# Patient Record
Sex: Female | Born: 1990 | Race: Black or African American | Hispanic: No | Smoking: Former smoker
Health system: Southern US, Community
[De-identification: ages and names within clinical notes are randomized; demographics above are authoritative.]

## PROBLEM LIST (undated history)

## (undated) HISTORY — PX: TUBAL LIGATION: SHX77

---
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
## 2009-07-20 DIAGNOSIS — A6000 Herpesviral infection of urogenital system, unspecified: Secondary | ICD-10-CM | POA: Insufficient documentation

## 2010-01-09 ENCOUNTER — Emergency Department: Payer: Self-pay | Admitting: Emergency Medicine

## 2010-05-18 ENCOUNTER — Observation Stay: Payer: Self-pay | Admitting: Obstetrics and Gynecology

## 2010-05-25 ENCOUNTER — Ambulatory Visit: Payer: Self-pay | Admitting: Family Medicine

## 2010-08-21 ENCOUNTER — Observation Stay: Payer: Self-pay | Admitting: Obstetrics and Gynecology

## 2010-08-24 ENCOUNTER — Ambulatory Visit: Payer: Self-pay | Admitting: Obstetrics and Gynecology

## 2010-08-25 ENCOUNTER — Inpatient Hospital Stay: Payer: Self-pay | Admitting: Obstetrics and Gynecology

## 2011-02-13 LAB — HM PAP SMEAR: HM Pap smear: NEGATIVE

## 2011-11-01 ENCOUNTER — Emergency Department: Payer: Self-pay | Admitting: Emergency Medicine

## 2011-11-01 LAB — URINALYSIS, COMPLETE
Bilirubin,UR: NEGATIVE
Glucose,UR: NEGATIVE mg/dL (ref 0–75)
Ketone: NEGATIVE
RBC,UR: 39 /HPF (ref 0–5)
Specific Gravity: 1.025 (ref 1.003–1.030)
Squamous Epithelial: 48
Transitional Epi: 3
WBC UR: 145 /HPF (ref 0–5)

## 2011-11-01 LAB — CBC
HCT: 37.3 % (ref 35.0–47.0)
HGB: 12.5 g/dL (ref 12.0–16.0)
Platelet: 186 10*3/uL (ref 150–440)
RBC: 4.33 10*6/uL (ref 3.80–5.20)
RDW: 15.8 % — ABNORMAL HIGH (ref 11.5–14.5)
WBC: 14.1 10*3/uL — ABNORMAL HIGH (ref 3.6–11.0)

## 2011-11-01 LAB — COMPREHENSIVE METABOLIC PANEL
Co2: 22 mmol/L (ref 21–32)
EGFR (African American): >60
EGFR (Non-African Amer.): >60
Glucose: 106 mg/dL — ABNORMAL HIGH (ref 65–99)
SGOT(AST): 9 U/L — ABNORMAL LOW (ref 15–37)
SGPT (ALT): 11 U/L — ABNORMAL LOW (ref 12–78)

## 2011-11-01 LAB — HCG, QUANTITATIVE, PREGNANCY: Beta Hcg, Quant.: 53803 m[IU]/mL — ABNORMAL HIGH

## 2011-11-01 LAB — WET PREP, GENITAL

## 2011-11-30 ENCOUNTER — Ambulatory Visit: Payer: Self-pay | Admitting: Advanced Practice Midwife

## 2012-01-21 ENCOUNTER — Emergency Department: Payer: Self-pay | Admitting: Emergency Medicine

## 2012-01-21 LAB — URINALYSIS, COMPLETE
Bilirubin,UR: NEGATIVE
Glucose,UR: NEGATIVE mg/dL (ref 0–75)
Nitrite: NEGATIVE
Ph: 5 (ref 4.5–8.0)
Specific Gravity: 1.026 (ref 1.003–1.030)
Squamous Epithelial: 20
WBC UR: 34 /HPF (ref 0–5)

## 2012-01-21 LAB — COMPREHENSIVE METABOLIC PANEL
Anion Gap: 10 (ref 7–16)
BUN: 8 mg/dL (ref 7–18)
Bilirubin,Total: 0.2 mg/dL (ref 0.2–1.0)
Chloride: 106 mmol/L (ref 98–107)
Creatinine: 0.57 mg/dL — ABNORMAL LOW (ref 0.60–1.30)
EGFR (Non-African Amer.): >60
Osmolality: 275 (ref 275–301)
Potassium: 3.1 mmol/L — ABNORMAL LOW (ref 3.5–5.1)
Sodium: 138 mmol/L (ref 136–145)
Total Protein: 7.3 g/dL (ref 6.4–8.2)

## 2012-01-21 LAB — CBC
MCH: 29.2 pg (ref 26.0–34.0)
MCHC: 33.3 g/dL (ref 32.0–36.0)
Platelet: 162 10*3/uL (ref 150–440)
RDW: 13.8 % (ref 11.5–14.5)
WBC: 10.7 10*3/uL (ref 3.6–11.0)

## 2012-01-23 LAB — URINE CULTURE

## 2012-03-04 ENCOUNTER — Observation Stay: Payer: Self-pay | Admitting: Obstetrics and Gynecology

## 2012-03-04 LAB — URINALYSIS, COMPLETE
Bilirubin,UR: NEGATIVE
Blood: NEGATIVE
Glucose,UR: NEGATIVE mg/dL (ref 0–75)
Nitrite: NEGATIVE
Protein: 30
RBC,UR: 18 /HPF (ref 0–5)
Specific Gravity: 1.026 (ref 1.003–1.030)

## 2012-04-16 ENCOUNTER — Observation Stay: Payer: Self-pay | Admitting: Obstetrics and Gynecology

## 2012-04-19 ENCOUNTER — Observation Stay: Payer: Self-pay

## 2012-04-20 LAB — URINALYSIS, COMPLETE
Bilirubin,UR: NEGATIVE
Blood: NEGATIVE
Glucose,UR: NEGATIVE mg/dL (ref 0–75)
Ph: 6 (ref 4.5–8.0)
RBC,UR: 4 /HPF (ref 0–5)
Squamous Epithelial: 8
Transitional Epi: 1
WBC UR: 13 /HPF (ref 0–5)

## 2012-04-20 LAB — PROTEIN / CREATININE RATIO, URINE: Protein, Random Urine: 60 mg/dL — ABNORMAL HIGH (ref 0–12)

## 2012-04-27 ENCOUNTER — Inpatient Hospital Stay: Payer: Self-pay | Admitting: Obstetrics and Gynecology

## 2012-04-27 LAB — BASIC METABOLIC PANEL
BUN: 4 mg/dL — ABNORMAL LOW (ref 7–18)
Calcium, Total: 8.4 mg/dL — ABNORMAL LOW (ref 8.5–10.1)
Chloride: 106 mmol/L (ref 98–107)
Co2: 26 mmol/L (ref 21–32)
Creatinine: 0.53 mg/dL — ABNORMAL LOW (ref 0.60–1.30)
EGFR (African American): >60
Osmolality: 275 (ref 275–301)
Potassium: 2.8 mmol/L — ABNORMAL LOW (ref 3.5–5.1)

## 2012-04-27 LAB — PIH PROFILE
Anion Gap: 7 (ref 7–16)
Co2: 26 mmol/L (ref 21–32)
Creatinine: 0.41 mg/dL — ABNORMAL LOW (ref 0.60–1.30)
HCT: 30.1 % — ABNORMAL LOW (ref 35.0–47.0)
HGB: 9.9 g/dL — ABNORMAL LOW (ref 12.0–16.0)
MCHC: 32.9 g/dL (ref 32.0–36.0)
Osmolality: 273 (ref 275–301)
Platelet: 162 10*3/uL (ref 150–440)
Potassium: 2.8 mmol/L — ABNORMAL LOW (ref 3.5–5.1)
RDW: 15.4 % — ABNORMAL HIGH (ref 11.5–14.5)
Uric Acid: 4.7 mg/dL (ref 2.6–6.0)
WBC: 8.9 10*3/uL (ref 3.6–11.0)

## 2012-04-27 LAB — PROTEIN / CREATININE RATIO, URINE
Creatinine, Urine: 99.1 mg/dL (ref 30.0–125.0)
Protein, Random Urine: 14 mg/dL — ABNORMAL HIGH (ref 0–12)

## 2012-04-28 LAB — BASIC METABOLIC PANEL
Calcium, Total: 8.1 mg/dL — ABNORMAL LOW (ref 8.5–10.1)
Co2: 26 mmol/L (ref 21–32)
Glucose: 76 mg/dL (ref 65–99)
Osmolality: 272 (ref 275–301)
Sodium: 138 mmol/L (ref 136–145)

## 2012-04-29 LAB — BASIC METABOLIC PANEL
Anion Gap: 6 — ABNORMAL LOW (ref 7–16)
BUN: 5 mg/dL — ABNORMAL LOW (ref 7–18)
Calcium, Total: 8.6 mg/dL (ref 8.5–10.1)
Chloride: 108 mmol/L — ABNORMAL HIGH (ref 98–107)
EGFR (African American): >60
EGFR (Non-African Amer.): >60
Glucose: 81 mg/dL (ref 65–99)
Osmolality: 274 (ref 275–301)
Potassium: 3.8 mmol/L (ref 3.5–5.1)
Sodium: 139 mmol/L (ref 136–145)

## 2012-05-01 ENCOUNTER — Emergency Department: Payer: Self-pay | Admitting: Emergency Medicine

## 2012-05-01 LAB — COMPREHENSIVE METABOLIC PANEL
Albumin: 2.4 g/dL — ABNORMAL LOW (ref 3.4–5.0)
Alkaline Phosphatase: 121 U/L (ref 50–136)
Anion Gap: 8 (ref 7–16)
BUN: 6 mg/dL — ABNORMAL LOW (ref 7–18)
Bilirubin,Total: 0.5 mg/dL (ref 0.2–1.0)
Calcium, Total: 8.6 mg/dL (ref 8.5–10.1)
Chloride: 106 mmol/L (ref 98–107)
Creatinine: 0.67 mg/dL (ref 0.60–1.30)
Osmolality: 270 (ref 275–301)
Potassium: 3.6 mmol/L (ref 3.5–5.1)
SGOT(AST): 29 U/L (ref 15–37)
Sodium: 137 mmol/L (ref 136–145)
Total Protein: 6.6 g/dL (ref 6.4–8.2)

## 2012-05-01 LAB — URINALYSIS, COMPLETE
Bilirubin,UR: NEGATIVE
Glucose,UR: NEGATIVE mg/dL (ref 0–75)
Nitrite: NEGATIVE
Ph: 6 (ref 4.5–8.0)
Protein: 30
Specific Gravity: 1.023 (ref 1.003–1.030)
Squamous Epithelial: <1
WBC UR: 5 /HPF (ref 0–5)

## 2012-05-01 LAB — CBC WITH DIFFERENTIAL/PLATELET
Basophil #: 0 10*3/uL (ref 0.0–0.1)
Basophil %: 0.4 %
Eosinophil #: 0.2 10*3/uL (ref 0.0–0.7)
HGB: 9.1 g/dL — ABNORMAL LOW (ref 12.0–16.0)
Neutrophil #: 10.1 10*3/uL — ABNORMAL HIGH (ref 1.4–6.5)
Neutrophil %: 86.1 %
Platelet: 183 10*3/uL (ref 150–440)
WBC: 11.7 10*3/uL — ABNORMAL HIGH (ref 3.6–11.0)

## 2012-05-01 LAB — LIPASE, BLOOD: Lipase: 58 U/L — ABNORMAL LOW (ref 73–393)

## 2012-05-23 ENCOUNTER — Emergency Department: Payer: Self-pay | Admitting: Emergency Medicine

## 2012-05-23 LAB — URINALYSIS, COMPLETE
Glucose,UR: NEGATIVE mg/dL (ref 0–75)
Nitrite: NEGATIVE
Protein: 100
Specific Gravity: 1.006 (ref 1.003–1.030)
Squamous Epithelial: 2
WBC UR: 466 /HPF (ref 0–5)

## 2012-05-23 LAB — COMPREHENSIVE METABOLIC PANEL
Albumin: 3.5 g/dL (ref 3.4–5.0)
Alkaline Phosphatase: 93 U/L (ref 50–136)
Anion Gap: 6 — ABNORMAL LOW (ref 7–16)
BUN: 7 mg/dL (ref 7–18)
Calcium, Total: 8.9 mg/dL (ref 8.5–10.1)
Creatinine: 0.88 mg/dL (ref 0.60–1.30)
EGFR (African American): >60
EGFR (Non-African Amer.): >60
Glucose: 110 mg/dL — ABNORMAL HIGH (ref 65–99)
Osmolality: 276 (ref 275–301)
SGOT(AST): 14 U/L — ABNORMAL LOW (ref 15–37)
SGPT (ALT): 14 U/L (ref 12–78)
Total Protein: 8.6 g/dL — ABNORMAL HIGH (ref 6.4–8.2)

## 2012-05-23 LAB — CBC
HCT: 37.4 % (ref 35.0–47.0)
HGB: 11.8 g/dL — ABNORMAL LOW (ref 12.0–16.0)
MCV: 80 fL (ref 80–100)
Platelet: 286 10*3/uL (ref 150–440)
RBC: 4.67 10*6/uL (ref 3.80–5.20)
RDW: 16.2 % — ABNORMAL HIGH (ref 11.5–14.5)
WBC: 13.3 10*3/uL — ABNORMAL HIGH (ref 3.6–11.0)

## 2012-05-25 LAB — URINE CULTURE

## 2013-03-26 ENCOUNTER — Emergency Department: Payer: Self-pay | Admitting: Internal Medicine

## 2013-06-15 ENCOUNTER — Emergency Department: Payer: Self-pay | Admitting: Emergency Medicine

## 2013-08-10 ENCOUNTER — Emergency Department: Payer: Self-pay | Admitting: Emergency Medicine

## 2013-08-10 LAB — CBC
HCT: 31 % — ABNORMAL LOW (ref 35.0–47.0)
HGB: 9.8 g/dL — ABNORMAL LOW (ref 12.0–16.0)
MCH: 23 pg — AB (ref 26.0–34.0)
MCHC: 31.6 g/dL — ABNORMAL LOW (ref 32.0–36.0)
MCV: 73 fL — ABNORMAL LOW (ref 80–100)
PLATELETS: 197 10*3/uL (ref 150–440)
RBC: 4.26 10*6/uL (ref 3.80–5.20)
RDW: 17.6 % — ABNORMAL HIGH (ref 11.5–14.5)
WBC: 12.4 10*3/uL — ABNORMAL HIGH (ref 3.6–11.0)

## 2013-08-10 LAB — URINALYSIS, COMPLETE
Bacteria: NONE SEEN
RBC,UR: 6300 /HPF (ref 0–5)
SPECIFIC GRAVITY: 1.034 (ref 1.003–1.030)
Squamous Epithelial: NONE SEEN
WBC UR: 82 /HPF (ref 0–5)

## 2013-08-10 LAB — BASIC METABOLIC PANEL
ANION GAP: 6 — AB (ref 7–16)
BUN: 17 mg/dL (ref 7–18)
CALCIUM: 8.4 mg/dL — AB (ref 8.5–10.1)
CHLORIDE: 107 mmol/L (ref 98–107)
CO2: 27 mmol/L (ref 21–32)
Creatinine: 1.17 mg/dL (ref 0.60–1.30)
EGFR (African American): >60
EGFR (Non-African Amer.): >60
GLUCOSE: 87 mg/dL (ref 65–99)
Osmolality: 280 (ref 275–301)
Potassium: 3.7 mmol/L (ref 3.5–5.1)
SODIUM: 140 mmol/L (ref 136–145)

## 2013-11-15 ENCOUNTER — Emergency Department: Payer: Self-pay | Admitting: Emergency Medicine

## 2014-04-27 ENCOUNTER — Emergency Department: Payer: Self-pay | Admitting: Internal Medicine

## 2014-05-28 NOTE — Op Note (Signed)
PATIENT NAME:  Sara GasmanWALKER, Sara S MR#:  [id number] DATE OF BIRTH:  26-Dec-1990  DATE OF PROCEDURE:  04/27/2012  DATE OF DELIVERY: 04/27/2012 at 1403.   PREOPERATIVE DIAGNOSES:  1.  Preeclampsia. 2.  Incisional pain.  3.  Hypokalemia.   POSTOPERATIVE DIAGNOSES: 1.  Preeclampsia. 2.  Incisional pain.  3.  Hypokalemia.   PROCEDURE:  1.  Elective repeat low transverse cesarean section.  2.  [place] bilateral tubal ligation.   ANESTHESIA: Spinal.   SURGEON: Suzy Bouchardhomas J Schermerhorn, M.D.   FIRST ASSISTANT: Gordy, scrub tech.   INDICATIONS: A 24 year old, gravida 3, para 2 patient at 2937 + 5 weeks estimated gestational age came in complaining of contractions and incisional pain. The patient was noted to have elevated blood pressures with blood pressures ranging systolically between 140 and 150 and diastolic between 80 and 100. The patient did complain of a headache the day of the procedure. The patient's electrolytes showed significant hypokalemia at a level of  2.8. The patient has elected for permanent sterilization.   PROCEDURE: After adequate spinal anesthesia, the patient was placed in the dorsal supine position. The patient's abdomen was prepped and draped in normal sterile fashion. A Pfannenstiel incision was made 2 fingerbreadths above the symphysis pubis. Sharp dissection was used to identify the fascia. The fascia was opened in the midline and opened in a transverse fashion. The superior aspect of the fascia was grasped with Kocher clamps and the rectus muscles were dissected free. The inferior aspect of the fascia was grasped with Kocher clamps and the pyramidalis muscle was dissected free. Entry into the peritoneal cavity was accomplished sharply. The vesicouterine peritoneal fold was opened and the bladder was reflected inferiorly. A low transverse uterine incision was made. Upon entry into the endometrial cavity, clear fluid resulted. The fetal head was brought to the incision. A vacuum  was applied to the occiput of the infant and one gentle pull allowed for delivery of the head. The vacuum was removed. The shoulders and body delivered without difficulty. A vigorous female was passed to Dr. Beckie Saltsasnadi who assigned Apgar scores of 9 and 9. The placenta was manually delivered and the uterus was exteriorized. The endometrial cavity was wiped clean with laparotomy tape and the cervix was opened with ring forceps. The ring forceps was passed off the operative field. The uterine incision was closed with a running 1 chromic suture. Good hemostasis was noted. The fallopian tubes and ovaries appeared normal. The posterior cul-de-sac was irrigated and suctioned.   Attention was directed to the patient's right fallopian tube, which was grasped at the midportion and two separate 0 plain gut sutures were applied and a 1.5 cm portion of the fallopian tube removed. Good hemostasis was noted. A similar procedure was repeated on the patient's left fallopian tube and again, 2 separate 0 plain gut sutures were placed on the fallopian tube and a 1.5 cm portion of the fallopian tube removed. Good hemostasis was noted. The uterus was placed back into the abdominal cavity without difficulty. The paracolic gutters were then wiped clean with laparotomy tape. Tubal ligation sites appeared hemostatic. The On-Q pump was then brought up to the operative field and the catheters were fed infraumbilically into a subfascial area. The fascia was then closed above this with 0 Vicryl suture in a running nonlocking fashion. Good approximation of edges. Subcutaneous tissues were irrigated and a few relaxation incisions were made to free up the subcutaneous tissues that had resulted from prior scarring. The skin was  reapproximated with the Insorb absorbable sutures with good cosmetic effect. The On-Q pump catheters were secured at the skin level with Dermabond and secured with Steri-Strips and each catheter was loaded with 5 mL of 0.5%  Marcaine. The patient did receive 2 grams IV Ancef prior to commencement of the case. There were no complications. Estimated blood loss 500 mL. Intraoperative fluids 2100 mL. The patient tolerated the procedure well, was taken to the recovery room in good condition.     ____________________________ Suzy Bouchard, MD tjs:aw D: 04/27/2012 14:55:35 ET T: 04/27/2012 15:08:47 ET JOB#: [id number]  cc: Suzy Bouchard, MD, <Dictator> Suzy Bouchard MD ELECTRONICALLY SIGNED 04/28/2012 8:40

## 2014-06-15 NOTE — H&P (Signed)
L&D Evaluation:  History:  HPI 24 yo at 37+5 edc 05/14/12 c/o ctx early this am. + incisional pain. On L+D bp elevated 140-150/ 80-100. + h/a today . Denies scotomata. Labs show Hypokalemia K+ 2.8 confirmed on repeat. 2 prior c/s . Pt desires BTL   Patient's Medical History h/o HSV,  ? PIH with first preg., anemia, obesity   Patient's Surgical History c/s x 2   Medications Pre Natal Vitamins   Allergies NKDA   Social History none   Family History Non-Contributory   ROS:  ROS All systems were reviewed.  HEENT, CNS, GI, GU, Respiratory, CV, Renal and Musculoskeletal systems were found to be normal.   Exam:  Vital Signs BP >140/90   Urine Protein c/p ratio141   General no apparent distress   Mental Status clear   Chest clear   Heart normal sinus rhythm, RRR   Abdomen gravid, non-tender   Estimated Fetal Weight Average for gestational age   Reflexes 1+   Pelvic TFT ext os/ long/ blt   Mebranes Intact   FHT normal rate with no decels, infrequent CTX   Fetal Heart Rate 140   Ucx irregular   Skin dry   Impression:  Impression Elevated BP+ H/A  c/w PIH, hypokalemia etiology uncertain, Ctx with incisional pain   Plan:  Plan IVF add KCL, given summation of the above issues will proceed to repeat c/s and BTL.   Electronic Signatures: Helen Cuff, Ihor Austinhomas J (MD)  (Signed 23-Mar-14 12:32)  Authored: L&D Evaluation   Last Updated: 23-Mar-14 12:32 by Suzy BouchardSchermerhorn, Danyetta Gillham J (MD)

## 2014-07-26 ENCOUNTER — Encounter: Payer: Self-pay | Admitting: Emergency Medicine

## 2014-07-26 ENCOUNTER — Emergency Department
Admission: EM | Admit: 2014-07-26 | Discharge: 2014-07-26 | Disposition: A | Discharge status: Home / Self Care | Payer: Managed Care, Other (non HMO) | Attending: Emergency Medicine | Admitting: Emergency Medicine

## 2014-07-26 DIAGNOSIS — Z720 Tobacco use: Secondary | ICD-10-CM | POA: Insufficient documentation

## 2014-07-26 DIAGNOSIS — R21 Rash and other nonspecific skin eruption: Secondary | ICD-10-CM | POA: Diagnosis present

## 2014-07-26 DIAGNOSIS — K047 Periapical abscess without sinus: Secondary | ICD-10-CM

## 2014-07-26 MED ORDER — AMOXICILLIN 500 MG PO TABS: 500.0000 mg | ORAL_TABLET | Freq: Two times a day (BID) | ORAL | Status: DC

## 2014-07-26 MED ORDER — KETOROLAC TROMETHAMINE 10 MG PO TABS
10.0000 mg | ORAL_TABLET | Freq: Once | ORAL | Status: AC
  Administered 2014-07-26: 10 mg via ORAL

## 2014-07-26 MED ORDER — TRAMADOL HCL 50 MG PO TABS
50.0000 mg | ORAL_TABLET | Freq: Once | ORAL | Status: AC
  Administered 2014-07-26: 50 mg via ORAL

## 2014-07-26 MED ORDER — TRAMADOL HCL 50 MG PO TABS: 50.0000 mg | ORAL_TABLET | Freq: Four times a day (QID) | ORAL | Status: DC | PRN

## 2014-07-26 MED ORDER — KETOROLAC TROMETHAMINE 10 MG PO TABS: 10.0000 mg | ORAL_TABLET | Freq: Four times a day (QID) | ORAL | Status: DC | PRN

## 2014-07-26 MED ORDER — TRAMADOL HCL 50 MG PO TABS
ORAL_TABLET | ORAL | Status: AC
  Administered 2014-07-26: 50 mg via ORAL
  Filled 2014-07-26: qty 1

## 2014-07-26 MED ORDER — AMOXICILLIN 500 MG PO CAPS
500.0000 mg | ORAL_CAPSULE | Freq: Once | ORAL | Status: AC
  Administered 2014-07-26: 500 mg via ORAL

## 2014-07-26 MED ORDER — AMOXICILLIN 500 MG PO CAPS
ORAL_CAPSULE | ORAL | Status: AC
  Administered 2014-07-26: 500 mg via ORAL
  Filled 2014-07-26: qty 1

## 2014-07-26 MED ORDER — KETOROLAC TROMETHAMINE 10 MG PO TABS
ORAL_TABLET | ORAL | Status: AC
  Administered 2014-07-26: 10 mg via ORAL
  Filled 2014-07-26: qty 1

## 2014-07-26 NOTE — ED Provider Notes (Signed)
[facility] Emergency Department Provider Note  ____________________________________________  Time seen: 2039   I have reviewed the triage vital signs and the nursing notes.   HISTORY  Chief Complaint Facial Pain    HPI Sara Morrison is a 24 y.o. female arrives here today with left sided jawline pain says it swells off-and-on particularly worse after eating says it is gone down with Motrin but causes bad headaches and continues to stay inflamed and painful she is unsure the cause of it currently rates it as about a 4 out of 10 sometimes up to an 8 out of 10 nothing making it particularly better or worse denies any other complaints at this time thinks it may be dental pain   History reviewed. No pertinent past medical history.  There are no active problems to display for this patient.   Past Surgical History  Procedure Laterality Date  . Tubal ligation      Current Outpatient Rx  Name  Route  Sig  Dispense  Refill  . amoxicillin (AMOXIL) 500 MG tablet   Oral   Take 1 tablet (500 mg total) by mouth 2 (two) times daily.   30 tablet   0   . ketorolac (TORADOL) 10 MG tablet   Oral   Take 1 tablet (10 mg total) by mouth every 6 (six) hours as needed.   20 tablet   0   . traMADol (ULTRAM) 50 MG tablet   Oral   Take 1 tablet (50 mg total) by mouth every 6 (six) hours as needed.   12 tablet   0     Allergies Review of patient's allergies indicates no known allergies.  No family history on file.  Social History History  Substance Use Topics  . Smoking status: Current Some Day Smoker  . Smokeless tobacco: Not on file  . Alcohol Use: No    Review of Systems Constitutional: No fever/chills Eyes: No visual changes. ENT: No sore throat. Cardiovascular: Denies chest pain. Respiratory: Denies shortness of breath. Gastrointestinal: No abdominal pain.  No nausea, no vomiting.  No diarrhea.  No constipation. Genitourinary: Negative for  dysuria. Musculoskeletal: Negative for back pain. Skin: Negative for rash. Neurological: Negative for headaches, focal weakness or numbness.  10-point ROS otherwise negative.  ____________________________________________   PHYSICAL EXAM:  VITAL SIGNS: ED Triage Vitals  Enc Vitals Group     BP 07/26/14 1917 158/100 mmHg     Pulse Rate 07/26/14 1917 94     Resp 07/26/14 1917 20     Temp 07/26/14 1917 98.2 F (36.8 C)     Temp Source 07/26/14 1917 Oral     SpO2 07/26/14 1917 100 %     Weight 07/26/14 1917 230 lb (104.327 kg)     Height 07/26/14 1917  (1.6 m)     Head Cir --      Peak Flow --      Pain Score 07/26/14 1918 4     Pain Loc --      Pain Edu? --      Excl. in GC? --     Constitutional: Alert and oriented. Well appearing and in no acute distress. Eyes: Conjunctivae are normal. PERRL. EOMI. Head: Atraumatic. Nose: No congestion/rhinnorhea. Mouth/Throat: Mucous membranes are moist. Mild gum irritation along the left mandible no focal abscess visualized she has a filling in one of her last molars wisdom teeth are not in the irritation to the gum above where the wisdom teeth  would evolve Neck: No stridor.    Cardiovascular: Normal rate, regular rhythm. Grossly normal heart sounds.  Good peripheral circulation. Respiratory: Normal respiratory effort.  No retractions. Lungs CTAB.  Musculoskeletal: No lower extremity tenderness nor edema.  No joint effusions. Neurologic:  Normal speech and language. No gross focal neurologic deficits are appreciated. Speech is normal. No gait instability. Skin:  Skin is warm, dry and intact. No rash noted. Psychiatric: Mood and affect are normal. Speech and behavior are normal.  ____________________________________________      PROCEDURES  Procedure(s) performed: None  Critical Care performed: No  ____________________________________________   INITIAL IMPRESSION / ASSESSMENT AND PLAN / ED COURSE  Pertinent labs &  imaging results that were available during my care of the patient were reviewed by me and considered in my medical decision making (see chart for details).  Initial impression on this patient is dental infection she has a tooth that is carried with a filling and it irritation around the gumline there that she points to for pain that continues to swell is along her gum started on amoxicillin and Motrin have follow-up with her dentist tomorrow return if any acute concerns or worsening symptoms ____________________________________________   FINAL CLINICAL IMPRESSION(S) / ED DIAGNOSES  Final diagnoses:  Dental infection      [name], PA-C 07/26/14 2108  Emily Filbert, MD 07/26/14 2142

## 2014-07-26 NOTE — ED Notes (Addendum)
Patient ambulatory to triage with steady gait, without difficulty or distress noted; pt reports left sided facial swelling for last few days causing head to hurt; st pain increases with attempts to eat and unsure if it is dental pain

## 2014-11-30 ENCOUNTER — Emergency Department
Admission: EM | Admit: 2014-11-30 | Discharge: 2014-11-30 | Disposition: A | Discharge status: Home / Self Care | Payer: Managed Care, Other (non HMO) | Attending: Emergency Medicine | Admitting: Emergency Medicine

## 2014-11-30 ENCOUNTER — Encounter: Payer: Self-pay | Admitting: *Deleted

## 2014-11-30 DIAGNOSIS — Z792 Long term (current) use of antibiotics: Secondary | ICD-10-CM | POA: Insufficient documentation

## 2014-11-30 DIAGNOSIS — J029 Acute pharyngitis, unspecified: Secondary | ICD-10-CM | POA: Diagnosis present

## 2014-11-30 DIAGNOSIS — J069 Acute upper respiratory infection, unspecified: Secondary | ICD-10-CM | POA: Diagnosis not present

## 2014-11-30 DIAGNOSIS — Z720 Tobacco use: Secondary | ICD-10-CM | POA: Insufficient documentation

## 2014-11-30 LAB — POCT RAPID STREP A: STREPTOCOCCUS, GROUP A SCREEN (DIRECT): NEGATIVE

## 2014-11-30 MED ORDER — FLUTICASONE PROPIONATE 50 MCG/ACT NA SUSP: 1.0000 | Freq: Every day | NASAL | Status: DC

## 2014-11-30 MED ORDER — BENZONATATE 100 MG PO CAPS: 100.0000 mg | ORAL_CAPSULE | Freq: Three times a day (TID) | ORAL | Status: DC | PRN

## 2014-11-30 NOTE — ED Notes (Addendum)
Pt has a sore throat and sinus congestion.    Sx began today.  Pt spitting up clear phlegm in triage.

## 2014-11-30 NOTE — ED Provider Notes (Signed)
[facility] Emergency Department Provider Note ____________________________________________  Time seen: 2010  I have reviewed the triage vital signs and the nursing notes.  HISTORY  Chief Complaint  Sore Throat  HPI Sara Morrison is a 24 y.o. female reports to the ED for evaluation of sore throat, cough, congestion that started yesterday. She has dosed 1 dose of Tylenol Cold and flu earlier today. She denies any interim fever, chills, or sweats. She also denies any sick contacts.She rates the pain to her throat at about a 5/10 in triage.  No past medical history on file.  There are no active problems to display for this patient.   Past Surgical History  Procedure Laterality Date  . Tubal ligation      Current Outpatient Rx  Name  Route  Sig  Dispense  Refill  . amoxicillin (AMOXIL) 500 MG tablet   Oral   Take 1 tablet (500 mg total) by mouth 2 (two) times daily.   30 tablet   0   . benzonatate (TESSALON PERLES) 100 MG capsule   Oral   Take 1 capsule (100 mg total) by mouth 3 (three) times daily as needed for cough (Take 1-2 per dose).   30 capsule   0   . fluticasone (FLONASE) 50 MCG/ACT nasal spray   Each Nare   Place 1 spray into both nostrils daily.   16 g   0   . ketorolac (TORADOL) 10 MG tablet   Oral   Take 1 tablet (10 mg total) by mouth every 6 (six) hours as needed.   20 tablet   0   . traMADol (ULTRAM) 50 MG tablet   Oral   Take 1 tablet (50 mg total) by mouth every 6 (six) hours as needed.   12 tablet   0    Allergies Review of patient's allergies indicates no known allergies.  No family history on file.  Social History Social History  Substance Use Topics  . Smoking status: Current Some Day Smoker  . Smokeless tobacco: None  . Alcohol Use: No    Review of Systems  Constitutional: Negative for fever. Eyes: Negative for visual changes. ENT: Positive for sore throat. Cardiovascular: Negative for chest  pain. Respiratory: Negative for shortness of breath. Reports cough Gastrointestinal: Negative for abdominal pain, vomiting and diarrhea. Genitourinary: Negative for dysuria. Musculoskeletal: Negative for back pain. Skin: Negative for rash. Neurological: Negative for headaches, focal weakness or numbness. ____________________________________________  PHYSICAL EXAM:  VITAL SIGNS: ED Triage Vitals  Enc Vitals Group     BP 11/30/14 2112 144/93 mmHg     Pulse Rate 11/30/14 2112 73     Resp 11/30/14 2112 20     Temp 11/30/14 2112 98.1 F (36.7 C)     Temp Source 11/30/14 2112 Oral     SpO2 11/30/14 2112 99 %     Weight 11/30/14 2112 235 lb (106.595 kg)     Height 11/30/14 2112 5\' 3"  (1.6 m)     Head Cir --      Peak Flow --      Pain Score 11/30/14 2114 5     Pain Loc --      Pain Edu? --      Excl. in GC? --    Constitutional: Alert and oriented. Well appearing and in no distress. Head: Normocephalic and atraumatic.      Eyes: Conjunctivae are normal. PERRL. Normal extraocular movements      Ears: Canals clear.  TMs intact bilaterally.   Nose: No congestion/rhinorrhea.   Mouth/Throat: Mucous membranes are moist. Uvula midline. Tonsils are flat and without erythema or exudate   Neck: Supple. No thyromegaly. Hematological/Lymphatic/Immunological: No cervical lymphadenopathy. Cardiovascular: Normal rate, regular rhythm.  Respiratory: Normal respiratory effort. No wheezes/rales/rhonchi. Gastrointestinal: Soft and nontender. No distention. Musculoskeletal: Nontender with normal range of motion in all extremities.  Neurologic:  Normal gait without ataxia. Normal speech and language. No gross focal neurologic deficits are appreciated. Skin:  Skin is warm, dry and intact. No rash noted. Psychiatric: Mood and affect are normal. Patient exhibits appropriate insight and judgment. ____________________________________________   LABS (pertinent positives/negatives) Labs Reviewed   POCT RAPID STREP A  ____________________________________________  INITIAL IMPRESSION / ASSESSMENT AND PLAN / ED COURSE  Patient was discussed listed with a likely viral URI. She'll be provided with prescriptions for Flonase and Tessalon Perles. She is encouraged to continue dosing her Tylenol multisymptom cough syrup. She will follow up with [facility] or primary provider as needed. ____________________________________________  FINAL CLINICAL IMPRESSION(S) / ED DIAGNOSES  Final diagnoses:  URI (upper respiratory infection)      Lissa Hoard, PA-C 12/01/14 0115  Jennye Moccasin, MD 12/04/14 1329

## 2014-11-30 NOTE — Discharge Instructions (Signed)
Upper Respiratory Infection, Adult Most upper respiratory infections (URIs) are a viral infection of the air passages leading to the lungs. A URI affects the nose, throat, and upper air passages. The most common type of URI is nasopharyngitis and is typically referred to as "the common cold." URIs run their course and usually go away on their own. Most of the time, a URI does not require medical attention, but sometimes a bacterial infection in the upper airways can follow a viral infection. This is called a secondary infection. Sinus and middle ear infections are common types of secondary upper respiratory infections. Bacterial pneumonia can also complicate a URI. A URI can worsen asthma and chronic obstructive pulmonary disease (COPD). Sometimes, these complications can require emergency medical care and may be life threatening.  CAUSES Almost all URIs are caused by viruses. A virus is a type of germ and can spread from one person to another.  RISKS FACTORS You may be at risk for a URI if:   You smoke.   You have chronic heart or lung disease.  You have a weakened defense (immune) system.   You are very young or very old.   You have nasal allergies or asthma.  You work in crowded or poorly ventilated areas.  You work in health care facilities or schools. SIGNS AND SYMPTOMS  Symptoms typically develop 2-3 days after you come in contact with a cold virus. Most viral URIs last 7-10 days. However, viral URIs from the influenza virus (flu virus) can last 14-18 days and are typically more severe. Symptoms may include:   Runny or stuffy (congested) nose.   Sneezing.   Cough.   Sore throat.   Headache.   Fatigue.   Fever.   Loss of appetite.   Pain in your forehead, behind your eyes, and over your cheekbones (sinus pain).  Muscle aches.  DIAGNOSIS  Your health care provider may diagnose a URI by:  Physical exam.  Tests to check that your symptoms are not due to  another condition such as:  Strep throat.  Sinusitis.  Pneumonia.  Asthma. TREATMENT  A URI goes away on its own with time. It cannot be cured with medicines, but medicines may be prescribed or recommended to relieve symptoms. Medicines may help:  Reduce your fever.  Reduce your cough.  Relieve nasal congestion. HOME CARE INSTRUCTIONS   Take medicines only as directed by your health care provider.   Gargle warm saltwater or take cough drops to comfort your throat as directed by your health care provider.  Use a warm mist humidifier or inhale steam from a shower to increase air moisture. This may make it easier to breathe.  Drink enough fluid to keep your urine clear or pale yellow.   Eat soups and other clear broths and maintain good nutrition.   Rest as needed.   Return to work when your temperature has returned to normal or as your health care provider advises. You may need to stay home longer to avoid infecting others. You can also use a face mask and careful hand washing to prevent spread of the virus.  Increase the usage of your inhaler if you have asthma.   Do not use any tobacco products, including cigarettes, chewing tobacco, or electronic cigarettes. If you need help quitting, ask your health care provider. PREVENTION  The best way to protect yourself from getting a cold is to practice good hygiene.   Avoid oral or hand contact with people with cold  symptoms.   Wash your hands often if contact occurs.  There is no clear evidence that vitamin C, vitamin E, echinacea, or exercise reduces the chance of developing a cold. However, it is always recommended to get plenty of rest, exercise, and practice good nutrition.  SEEK MEDICAL CARE IF:   You are getting worse rather than better.   Your symptoms are not controlled by medicine.   You have chills.  You have worsening shortness of breath.  You have brown or red mucus.  You have yellow or brown nasal  discharge.  You have pain in your face, especially when you bend forward.  You have a fever.  You have swollen neck glands.  You have pain while swallowing.  You have white areas in the back of your throat. SEEK IMMEDIATE MEDICAL CARE IF:   You have severe or persistent:  Headache.  Ear pain.  Sinus pain.  Chest pain.  You have chronic lung disease and any of the following:  Wheezing.  Prolonged cough.  Coughing up blood.  A change in your usual mucus.  You have a stiff neck.  You have changes in your:  Vision.  Hearing.  Thinking.  Mood. MAKE SURE YOU:   Understand these instructions.  Will watch your condition.  Will get help right away if you are not doing well or get worse.   This information is not intended to replace advice given to you by your health care provider. Make sure you discuss any questions you have with your health care provider.   Document Released: 07/18/2000 Document Revised: 06/08/2014 Document Reviewed: 04/29/2013 Elsevier Interactive Patient Education 2016 [employer]   Take the prescription meds as directed. Continue your OTC Tylenol Cold & Flu syrup. Consider starting a daily allergy medicine, like Allegra, Claritin, or Zyrtec.  [facility] as needed.

## 2014-11-30 NOTE — ED Notes (Signed)
Pt presents to ED c/o congestion, nausea/vomiting, and sore throat starting yesterday and getting progressively worse.

## 2014-11-30 NOTE — ED Notes (Signed)
Discussed discharge instructions, prescriptions, and follow-up care with patient. No questions or concerns at this time. Pt stable at discharge.  

## 2014-12-22 ENCOUNTER — Emergency Department: Payer: Managed Care, Other (non HMO)

## 2014-12-22 ENCOUNTER — Emergency Department
Admission: EM | Admit: 2014-12-22 | Discharge: 2014-12-22 | Discharge status: Against Medical Advice | Payer: Managed Care, Other (non HMO) | Attending: Student | Admitting: Student

## 2014-12-22 ENCOUNTER — Encounter: Payer: Self-pay | Admitting: Emergency Medicine

## 2014-12-22 DIAGNOSIS — R079 Chest pain, unspecified: Secondary | ICD-10-CM | POA: Diagnosis not present

## 2014-12-22 NOTE — ED Notes (Signed)
Patient ambulatory to triage with steady gait, without difficulty or distress noted; pt reports left sided CP for last several hours; denies radiating pain; st some tingling to left arm/hand; denies hx of same

## 2016-05-20 ENCOUNTER — Encounter: Payer: Self-pay | Admitting: Emergency Medicine

## 2016-05-20 ENCOUNTER — Emergency Department
Admission: EM | Admit: 2016-05-20 | Discharge: 2016-05-20 | Disposition: A | Discharge status: Home / Self Care | Payer: Commercial Managed Care - PPO | Attending: Emergency Medicine | Admitting: Emergency Medicine

## 2016-05-20 ENCOUNTER — Emergency Department: Payer: Commercial Managed Care - PPO

## 2016-05-20 DIAGNOSIS — W2203XA Walked into furniture, initial encounter: Secondary | ICD-10-CM | POA: Insufficient documentation

## 2016-05-20 DIAGNOSIS — Y999 Unspecified external cause status: Secondary | ICD-10-CM | POA: Insufficient documentation

## 2016-05-20 DIAGNOSIS — S9032XA Contusion of left foot, initial encounter: Secondary | ICD-10-CM | POA: Diagnosis not present

## 2016-05-20 DIAGNOSIS — S99922A Unspecified injury of left foot, initial encounter: Secondary | ICD-10-CM | POA: Diagnosis present

## 2016-05-20 DIAGNOSIS — Y929 Unspecified place or not applicable: Secondary | ICD-10-CM | POA: Insufficient documentation

## 2016-05-20 DIAGNOSIS — Z87891 Personal history of nicotine dependence: Secondary | ICD-10-CM | POA: Insufficient documentation

## 2016-05-20 DIAGNOSIS — Y9389 Activity, other specified: Secondary | ICD-10-CM | POA: Diagnosis not present

## 2016-05-20 NOTE — ED Notes (Signed)
Pt reports kicking couch with left foot pain behind fifth toe

## 2016-05-20 NOTE — ED Triage Notes (Signed)
Pt ambulatory to triage in NAD with slight limp, reports hit left foot on bottom of couch yesterday and c/o continued pain

## 2016-05-20 NOTE — ED Provider Notes (Signed)
[facility] Emergency Department Provider Note  ____________________________________________   First MD Initiated Contact with Patient 05/20/16 0522     (approximate)  I have reviewed the triage vital signs and the nursing notes.   HISTORY  Chief Complaint Foot Injury (left)    HPI Sara Morrison is a 26 y.o. female With no significant past medical history who presents for evaluation of acute onset of pain in her left foot after kicking a couch.  She just took a wrong step when she was cleaning and bumped the outer part of her left foot on the couch and had acute onset of pain that she describes as moderate.  She has a prior fracture to her right foot and she came into the emergency room tonight with her husband who is being seen for sore throat, so she decided she would "just get checked out to be sure".  She is able to ambulate with a slight limp.  She has no numbness or tingling in the foot.  She has no other injuries as documented below and the review of systems.   History reviewed. No pertinent past medical history.  There are no active problems to display for this patient.   Past Surgical History:  Procedure Laterality Date  . CESAREAN SECTION    . TUBAL LIGATION      Prior to Admission medications   Medication Sig Start Date End Date Taking? Authorizing Provider  amoxicillin (AMOXIL) 500 MG tablet Take 1 tablet (500 mg total) by mouth 2 (two) times daily. 07/26/14   III William C Ruffian, PA-C  benzonatate (TESSALON PERLES) 100 MG capsule Take 1 capsule (100 mg total) by mouth 3 (three) times daily as needed for cough (Take 1-2 per dose). 11/30/14   Jenise V Bacon Menshew, PA-C  fluticasone (FLONASE) 50 MCG/ACT nasal spray Place 1 spray into both nostrils daily. 11/30/14   Jenise V Bacon Menshew, PA-C  ketorolac (TORADOL) 10 MG tablet Take 1 tablet (10 mg total) by mouth every 6 (six) hours as needed. 07/26/14   III Kristine Garbe Ruffian, PA-C    traMADol (ULTRAM) 50 MG tablet Take 1 tablet (50 mg total) by mouth every 6 (six) hours as needed. 07/26/14   III Rosalyn Gess, PA-C    Allergies Patient has no known allergies.  History reviewed. No pertinent family history.  Social History Social History  Substance Use Topics  . Smoking status: Former Games developer  . Smokeless tobacco: Never Used  . Alcohol use No    Review of Systems Constitutional: No fever/chills Eyes: No visual changes. Respiratory: Denies shortness of breath. Gastrointestinal: No abdominal pain.  No nausea, no vomiting.   Musculoskeletal: Pain in left foot.  Negative for back pain. Skin: Negative for rash. Neurological: Negative for headaches, focal weakness or numbness.  ____________________________________________   PHYSICAL EXAM:  VITAL SIGNS: ED Triage Vitals [05/20/16 0223]  Enc Vitals Group     BP 121/81     Pulse Rate (!) 105     Resp 18     Temp 98.5 F (36.9 C)     Temp Source Oral     SpO2 99 %     Weight 238 lb (108 kg)     Height  (1.6 m)     Head Circumference      Peak Flow      Pain Score 8     Pain Loc      Pain Edu?  Excl. in GC?     Constitutional: Alert and oriented. Well appearing and in no acute distress. Head: Atraumatic. Cardiovascular: Normal rate, regular rhythm. Good peripheral circulation.  Respiratory: Normal respiratory effort.  No retractions.  Musculoskeletal: No evidence of any ecchymosis or swelling in her left foot.  There is tenderness to palpation all long the lateral aspect of her foot along the fifth metatarsal.  No other MSK injuries or abnormalities are appreciated. No lower extremity tenderness nor edema. No gross deformities of extremities. Neurologic:  Normal speech and language. No gross focal neurologic deficits are appreciated.  Skin:  Skin is warm, dry and intact. No rash noted. Psychiatric: Mood and affect are normal. Speech and behavior are  normal.  ____________________________________________   LABS (all labs ordered are listed, but only abnormal results are displayed)  Labs Reviewed - No data to display ____________________________________________  EKG  None - EKG not ordered by ED physician ____________________________________________  RADIOLOGY   Dg Foot Complete Left  Result Date: 05/20/2016 CLINICAL DATA:  Hit left foot on bottom of couch, with persistent left foot pain. Initial encounter. EXAM: LEFT FOOT - COMPLETE 3+ VIEW COMPARISON:  None. FINDINGS: There is no evidence of fracture or dislocation. The joint spaces are preserved. There is no evidence of talar subluxation; the subtalar joint is unremarkable in appearance. Mild soft tissue swelling is noted about the forefoot. IMPRESSION: No evidence of fracture or dislocation. Electronically Signed   By: Roanna Raider M.D.   On: 05/20/2016 02:57    ____________________________________________   PROCEDURES  Critical Care performed: No   Procedure(s) performed:   Procedures   ____________________________________________   INITIAL IMPRESSION / ASSESSMENT AND PLAN / ED COURSE  Pertinent labs & imaging results that were available during my care of the patient were reviewed by me and considered in my medical decision making (see chart for details).  Radiology does not appreciate any fracture or dislocation in her foot.  She is able to ambulate without difficulty.  I gave my usual customary management recommendations and return precautions.      ____________________________________________  FINAL CLINICAL IMPRESSION(S) / ED DIAGNOSES  Final diagnoses:  Contusion of left foot, initial encounter     MEDICATIONS GIVEN DURING THIS VISIT:  Medications - No data to display   NEW OUTPATIENT MEDICATIONS STARTED DURING THIS VISIT:  New Prescriptions   No medications on file    Modified Medications   No medications on file    Discontinued  Medications   No medications on file     Note:  This document was prepared using Dragon voice recognition software and may include unintentional dictation errors.    Loleta Rose, MD 05/20/16 [phone]

## 2017-08-05 LAB — HM HIV SCREENING LAB: HM HIV Screening: NEGATIVE

## 2017-12-24 ENCOUNTER — Ambulatory Visit
Admission: EM | Admit: 2017-12-24 | Discharge: 2017-12-24 | Disposition: A | Discharge status: Home / Self Care | Payer: Commercial Managed Care - PPO | Attending: Family Medicine | Admitting: Family Medicine

## 2017-12-24 ENCOUNTER — Other Ambulatory Visit: Payer: Self-pay

## 2017-12-24 DIAGNOSIS — N939 Abnormal uterine and vaginal bleeding, unspecified: Secondary | ICD-10-CM | POA: Diagnosis not present

## 2017-12-24 DIAGNOSIS — Z113 Encounter for screening for infections with a predominantly sexual mode of transmission: Secondary | ICD-10-CM

## 2017-12-24 LAB — CHLAMYDIA/NGC RT PCR (ARMC ONLY)
Chlamydia Tr: NOT DETECTED
N GONORRHOEAE: NOT DETECTED

## 2017-12-24 LAB — WET PREP, GENITAL
SPERM: NONE SEEN
TRICH WET PREP: NONE SEEN
YEAST WET PREP: NONE SEEN

## 2017-12-24 NOTE — ED Triage Notes (Signed)
Patient complains of vaginal spotting over last 3 months, vaginal irritation with bathing.

## 2017-12-24 NOTE — ED Provider Notes (Signed)
[facility]    CSN: [id number] Arrival date & time: 12/24/17  1630  History   Chief Complaint Chief Complaint  Patient presents with  . Menstrual Problem   HPI  27 year old female presents with vaginal spotting.  Patient reports that she has had ongoing issues with vaginal spotting.  Has been a problem over the past 3 months.  She recently had a negative pregnancy test and IUD placement.  She has continued to have spotting recently.  Patient states that she has not been recently checked for STDs and would like to be tested today.  Has had some vaginal irritation but no discharge.  No other associated symptoms.  Patient is essentially here for STD testing.  No other complaints or concerns at this time.   Social History Social History   Tobacco Use  . Smoking status: Former Games developer  . Smokeless tobacco: Never Used  Substance Use Topics  . Alcohol use: Yes    Comment: socially  . Drug use: Yes    Types: Marijuana     Allergies   Patient has no known allergies.   Review of Systems Review of Systems  Constitutional: Negative.   Genitourinary: Positive for menstrual problem.   Physical Exam Triage Vital Signs ED Triage Vitals  Enc Vitals Group     BP 12/24/17 1646 (!) 153/99     Pulse Rate 12/24/17 1646 95     Resp 12/24/17 1646 18     Temp 12/24/17 1646 98.7 F (37.1 C)     Temp Source 12/24/17 1646 Oral     SpO2 12/24/17 1646 100 %     Weight 12/24/17 1645 225 lb (102.1 kg)     Height 12/24/17 1645 5\' 3"  (1.6 m)     Head Circumference --      Peak Flow --      Pain Score 12/24/17 1644 0     Pain Loc --      Pain Edu? --      Excl. in GC? --    Updated Vital Signs BP (!) 153/99 (BP Location: Left Arm)   Pulse 95   Temp 98.7 F (37.1 C) (Oral)   Resp 18   Ht 5\' 3"  (1.6 m)   Wt 102.1 kg   SpO2 100%   BMI 39.86 kg/m   Visual Acuity Right Eye Distance:   Left Eye Distance:   Bilateral Distance:    Right Eye Near:   Left Eye Near:      Bilateral Near:     Physical Exam  Constitutional: She is oriented to person, place, and time. She appears well-developed. No distress.  HENT:  Head: Normocephalic and atraumatic.  Pulmonary/Chest: Effort normal. No respiratory distress.  Genitourinary:  Genitourinary Comments: Pelvic Exam: External: normal female genitalia without lesions or masses Vagina: Bleeding noted. No discharge. Cervix: normal without lesions or masses. Samples for Wet prep, GC/Chlamydia obtained  Neurological: She is alert and oriented to person, place, and time.  Psychiatric: She has a normal mood and affect. Her behavior is normal.  Nursing note and vitals reviewed.  UC Treatments / Results  Labs (all labs ordered are listed, but only abnormal results are displayed) Labs Reviewed  WET PREP, GENITAL - Abnormal; Notable for the following components:      Result Value   Clue Cells Wet Prep HPF POC PRESENT (*)    WBC, Wet Prep HPF POC RARE (*)    All other components within normal limits  CHLAMYDIA/NGC RT  PCR ([facility] ONLY)  HIV ANTIBODY (ROUTINE TESTING W REFLEX)  RPR    EKG None  Radiology No results found.  Procedures Procedures (including critical care time)  Medications Ordered in UC Medications - No data to display  Initial Impression / Assessment and Plan / UC Course  I have reviewed the triage vital signs and the nursing notes.  Pertinent labs & imaging results that were available during my care of the patient were reviewed by me and considered in my medical decision making (see chart for details).    27 year old female presents with vaginal spotting.  Patient desires STD screening.  Will call with results.  Final Clinical Impressions(s) / UC Diagnoses   Final diagnoses:  Vaginal spotting  Screening for STD (sexually transmitted disease)     Discharge Instructions     We will call with your results.  No need to worry based off your exam.  Take care  Dr. Adriana Simasook      ED Prescriptions    None     Controlled Substance Prescriptions [place] Controlled Substance Registry consulted? Not Applicable   Tommie SamsCook, Sheng Pritz G, DO 12/24/17 [id number]

## 2017-12-24 NOTE — Discharge Instructions (Signed)
We will call with your results.  No need to worry based off your exam.  Take care  Dr. Adriana Simasook

## 2017-12-26 LAB — RPR: RPR Ser Ql: NONREACTIVE

## 2017-12-26 LAB — HIV ANTIBODY (ROUTINE TESTING W REFLEX): HIV SCREEN 4TH GENERATION: NONREACTIVE

## 2018-02-05 HISTORY — PX: VAGINAL HYSTERECTOMY: SUR661

## 2018-09-12 ENCOUNTER — Other Ambulatory Visit: Payer: Self-pay

## 2018-09-12 DIAGNOSIS — Z20822 Contact with and (suspected) exposure to covid-19: Secondary | ICD-10-CM

## 2018-09-13 LAB — NOVEL CORONAVIRUS, NAA: SARS-CoV-2, NAA: NOT DETECTED

## 2019-02-06 HISTORY — PX: FRACTURE SURGERY: SHX138

## 2019-08-13 ENCOUNTER — Other Ambulatory Visit: Payer: Self-pay

## 2019-08-13 ENCOUNTER — Encounter: Payer: Self-pay | Admitting: Family Medicine

## 2019-08-13 ENCOUNTER — Ambulatory Visit: Payer: Self-pay | Admitting: Family Medicine

## 2019-08-13 DIAGNOSIS — B9689 Other specified bacterial agents as the cause of diseases classified elsewhere: Secondary | ICD-10-CM

## 2019-08-13 DIAGNOSIS — Z113 Encounter for screening for infections with a predominantly sexual mode of transmission: Secondary | ICD-10-CM

## 2019-08-13 DIAGNOSIS — N760 Acute vaginitis: Secondary | ICD-10-CM

## 2019-08-13 LAB — WET PREP FOR TRICH, YEAST, CLUE
Trichomonas Exam: NEGATIVE
Yeast Exam: NEGATIVE

## 2019-08-13 MED ORDER — METRONIDAZOLE 500 MG PO TABS: 500.0000 mg | ORAL_TABLET | Freq: Two times a day (BID) | ORAL | 0 refills | Status: AC

## 2019-08-13 NOTE — Progress Notes (Signed)
[facility] Department STI clinic/screening visit  Subjective:  Sara Morrison is a 29 y.o. female being seen today for an STI screening visit. The patient reports they do have symptoms.  Patient reports that they do not desire a pregnancy in the next year.  No LMP recorded. Patient has had a hysterectomy.   Patient has the following medical conditions:   Patient Active Problem List   Diagnosis Date Noted  . Hypertension 02/13/2011  . Obesity, unspecified 02/13/2011  . 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 Exposure to STD    HPI  Patient reports vaginal discharge, changed about 3 weeks ago to become more odorous, increased amount. Denies itching, swelling, denies pain.   Last HIV test per patient/review of record was last year Patient reports last pap was prior to hysterectomy- no longer indicated to have paps.   See flowsheet for further details and programmatic requirements.    The following portions of the patient's history were reviewed and updated as appropriate: allergies, current medications, past medical history, past social history, past surgical history and problem list.  Objective:  There were no vitals filed for this visit.  Physical Exam Vitals and nursing note reviewed.  Constitutional:      Appearance: Normal appearance.  HENT:     Head: Normocephalic and atraumatic.     Mouth/Throat:     Mouth: Mucous membranes are moist.     Pharynx: Oropharynx is clear. No oropharyngeal exudate or posterior oropharyngeal erythema.  Pulmonary:     Effort: Pulmonary effort is normal.  Abdominal:     General: Abdomen is flat.     Palpations: There is no mass.     Tenderness: There is no abdominal tenderness. There is no rebound.  Genitourinary:    General: Normal vulva.     Exam position: Lithotomy position.     Pubic Area: No rash or pubic lice.      Labia:        Right: No rash or lesion.        Left: No rash or  lesion.      Vagina: Vaginal discharge (white, creamy. pH > 4.5) present. No erythema, bleeding or lesions.     Uterus: Absent.      Adnexa: Right adnexa normal and left adnexa normal.     Rectum: Normal.  Lymphadenopathy:     Head:     Right side of head: No preauricular or posterior auricular adenopathy.     Left side of head: No preauricular or posterior auricular adenopathy.     Cervical: No cervical adenopathy.     Upper Body:     Right upper body: No supraclavicular or axillary adenopathy.     Left upper body: No supraclavicular or axillary adenopathy.     Lower Body: No right inguinal adenopathy. No left inguinal adenopathy.  Skin:    General: Skin is warm and dry.     Findings: No rash.  Neurological:     Mental Status: She is alert and oriented to person, place, and time.      Assessment and Plan:  Sara Morrison is a 29 y.o. female presenting to the [facility] Department for STI screening  1. Screening examination for venereal disease Patient accepted all screenings including oral, vaginal CT/GC and bloodwork for HIV/RPR.  Patient meets criteria for HepB screening? Yes. Ordered? Yes Patient meets criteria for HepC screening? Yes. Ordered? Yes  Treat wet  prep per standing order Discussed time line for [facility] results and that patient will be called with positive results and encouraged patient to call if she had not heard in 2 weeks.  Counseled to return or seek care for continued or worsening symptoms Recommended condom use with all sex  Patient is currently using s/p hysterectomy to prevent pregnancy.   - WET PREP FOR TRICH, YEAST, CLUE - HIV/HCV [place] [facility] - Chlamydia/Gonorrhea [place] [facility]- pharyrnx and vaginal - Syphilis Serology, [facility]  2. Reviewed immunization- last tdap in 2014, not due until 2024  Return if symptoms worsen or fail to improve.  No future appointments.  Federico Flake, MD

## 2019-08-14 NOTE — Progress Notes (Signed)
Wet mount reviewed and pt treated for BV per standing order and per Lyndel Safe, MD verbal order. Counseled pt per provider orders and pt states understanding. Provider orders completed.

## 2019-08-24 ENCOUNTER — Encounter: Payer: Self-pay | Admitting: Family Medicine

## 2019-08-24 LAB — HM HEPATITIS C SCREENING LAB: HM Hepatitis Screen: NEGATIVE

## 2019-08-24 LAB — HM HIV SCREENING LAB: HM HIV Screening: NEGATIVE

## 2019-08-24 LAB — HEPATITIS B SURFACE ANTIGEN

## 2019-09-25 ENCOUNTER — Other Ambulatory Visit: Payer: Self-pay

## 2019-09-25 ENCOUNTER — Ambulatory Visit: Payer: Self-pay | Admitting: Physician Assistant

## 2019-09-25 ENCOUNTER — Encounter: Payer: Self-pay | Admitting: Physician Assistant

## 2019-09-25 DIAGNOSIS — Z113 Encounter for screening for infections with a predominantly sexual mode of transmission: Secondary | ICD-10-CM

## 2019-09-25 LAB — WET PREP FOR TRICH, YEAST, CLUE
Trichomonas Exam: NEGATIVE
Yeast Exam: NEGATIVE

## 2019-09-25 NOTE — Progress Notes (Signed)
Wet mount reviewed with provider and is negative today, so no treatment needed for wet mount per standing order and per Sadie Haber, PA verbal order. Awaiting further TR's. Counseled pt per Sadie Haber, PA verbal order for pt to RTC if symptoms persist or worsen, but to not put anything in the vaginal canal for at least 48 hours prior to coming in for appt, and pt states understanding. Provider orders completed.

## 2019-09-25 NOTE — Progress Notes (Signed)
[facility] Department STI clinic/screening visit  Subjective:  Sara Morrison is a 29 y.o. female being seen today for an STI screening visit. The patient reports they do have symptoms.  Patient reports that they do not desire a pregnancy in the next year.   They reported they are not interested in discussing contraception today.  Patient's last menstrual period was 05/13/2016.   Patient has the following medical conditions:   Patient Active Problem List   Diagnosis Date Noted  . Hypertension 02/13/2011  . Obesity, unspecified 02/13/2011  . 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 SEXUALLY TRANSMITTED DISEASE    screening    HPI  Patient reports that she has had some milky, white discharge with some itching and slight odor for 5 days.  Denies other symptoms, chronic conditions and regular medicine.  Reports that her last HIV test was about 2 weeks ago and last pap was in 2020, at some time prior to her hysterectomy. Reports that she used a boric acid suppository in her vagina last night.   See flowsheet for further details and programmatic requirements.    The following portions of the patient's history were reviewed and updated as appropriate: allergies, current medications, past medical history, past social history, past surgical history and problem list.  Objective:  There were no vitals filed for this visit.  Physical Exam Constitutional:      General: She is not in acute distress.    Appearance: Normal appearance.  HENT:     Head: Normocephalic and atraumatic.     Comments: No nits, lice, or hair loss. No cervical, supraclavicular or axillary adenopathy.    Mouth/Throat:     Mouth: Mucous membranes are moist.     Pharynx: Oropharynx is clear. No oropharyngeal exudate or posterior oropharyngeal erythema.  Eyes:     Conjunctiva/sclera: Conjunctivae normal.  Pulmonary:     Effort: Pulmonary effort is normal.  Abdominal:      Palpations: Abdomen is soft. There is no mass.     Tenderness: There is no abdominal tenderness. There is no guarding or rebound.  Genitourinary:    General: Normal vulva.     Rectum: Normal.     Comments: External genitalia/pubic area without nits, lice, edema, erythema, lesions and inguinal adenopathy. Vagina with normal mucosa and small amount of white discharge. Cervix and uterus are surgically absent. Musculoskeletal:     Cervical back: Neck supple. No tenderness.  Skin:    General: Skin is warm and dry.     Findings: No bruising, erythema, lesion or rash.  Neurological:     Mental Status: She is alert and oriented to person, place, and time.  Psychiatric:        Mood and Affect: Mood normal.        Behavior: Behavior normal.        Thought Content: Thought content normal.        Judgment: Judgment normal.      Assessment and Plan:  Sara Morrison is a 29 y.o. female presenting to the [facility] Department for STI screening  1. Screening for STD (sexually transmitted disease) Patient into clinic with symptoms. Rec condoms with all sex. Await test results.  Counseled that RN will call if needs to RTC for treatment once results are back. - WET PREP FOR TRICH, YEAST, CLUE - Gonococcus culture - Chlamydia/Gonorrhea [place] [facility]     Return for 2-3 weeks for  TR's, and PRN.  No future appointments.  Matt Holmes, PA

## 2019-09-29 LAB — GONOCOCCUS CULTURE

## 2019-11-29 DIAGNOSIS — S82831A Other fracture of upper and lower end of right fibula, initial encounter for closed fracture: Secondary | ICD-10-CM | POA: Diagnosis not present

## 2019-11-29 DIAGNOSIS — S82201A Unspecified fracture of shaft of right tibia, initial encounter for closed fracture: Secondary | ICD-10-CM | POA: Diagnosis not present

## 2019-11-30 DIAGNOSIS — S82871B Displaced pilon fracture of right tibia, initial encounter for open fracture type I or II: Secondary | ICD-10-CM | POA: Insufficient documentation

## 2020-01-06 DIAGNOSIS — S82451D Displaced comminuted fracture of shaft of right fibula, subsequent encounter for closed fracture with routine healing: Secondary | ICD-10-CM | POA: Diagnosis not present

## 2020-01-06 DIAGNOSIS — S82831D Other fracture of upper and lower end of right fibula, subsequent encounter for closed fracture with routine healing: Secondary | ICD-10-CM | POA: Diagnosis not present

## 2020-01-06 DIAGNOSIS — S82301D Unspecified fracture of lower end of right tibia, subsequent encounter for closed fracture with routine healing: Secondary | ICD-10-CM | POA: Diagnosis not present

## 2020-01-06 DIAGNOSIS — S82871E Displaced pilon fracture of right tibia, subsequent encounter for open fracture type I or II with routine healing: Secondary | ICD-10-CM | POA: Diagnosis not present

## 2020-01-08 DIAGNOSIS — S82871E Displaced pilon fracture of right tibia, subsequent encounter for open fracture type I or II with routine healing: Secondary | ICD-10-CM | POA: Diagnosis not present

## 2020-01-08 DIAGNOSIS — Z20822 Contact with and (suspected) exposure to covid-19: Secondary | ICD-10-CM | POA: Diagnosis not present

## 2020-01-11 DIAGNOSIS — Z481 Encounter for planned postprocedural wound closure: Secondary | ICD-10-CM | POA: Diagnosis not present

## 2020-01-11 DIAGNOSIS — Z79899 Other long term (current) drug therapy: Secondary | ICD-10-CM | POA: Diagnosis not present

## 2020-01-11 DIAGNOSIS — S82871E Displaced pilon fracture of right tibia, subsequent encounter for open fracture type I or II with routine healing: Secondary | ICD-10-CM | POA: Diagnosis not present

## 2020-01-11 DIAGNOSIS — I10 Essential (primary) hypertension: Secondary | ICD-10-CM | POA: Diagnosis not present

## 2020-01-11 DIAGNOSIS — S82291A Other fracture of shaft of right tibia, initial encounter for closed fracture: Secondary | ICD-10-CM | POA: Diagnosis not present

## 2020-01-11 DIAGNOSIS — S91001A Unspecified open wound, right ankle, initial encounter: Secondary | ICD-10-CM | POA: Diagnosis not present

## 2020-01-11 DIAGNOSIS — G8918 Other acute postprocedural pain: Secondary | ICD-10-CM | POA: Diagnosis not present

## 2020-01-11 DIAGNOSIS — S82831A Other fracture of upper and lower end of right fibula, initial encounter for closed fracture: Secondary | ICD-10-CM | POA: Diagnosis not present

## 2020-01-11 DIAGNOSIS — S82871B Displaced pilon fracture of right tibia, initial encounter for open fracture type I or II: Secondary | ICD-10-CM | POA: Diagnosis not present

## 2020-01-11 DIAGNOSIS — S82871A Displaced pilon fracture of right tibia, initial encounter for closed fracture: Secondary | ICD-10-CM | POA: Diagnosis not present

## 2020-01-12 DIAGNOSIS — I10 Essential (primary) hypertension: Secondary | ICD-10-CM | POA: Diagnosis not present

## 2020-01-12 DIAGNOSIS — S82871E Displaced pilon fracture of right tibia, subsequent encounter for open fracture type I or II with routine healing: Secondary | ICD-10-CM | POA: Diagnosis not present

## 2020-01-12 DIAGNOSIS — Z79899 Other long term (current) drug therapy: Secondary | ICD-10-CM | POA: Diagnosis not present

## 2020-01-15 DIAGNOSIS — Z79899 Other long term (current) drug therapy: Secondary | ICD-10-CM | POA: Diagnosis not present

## 2020-01-15 DIAGNOSIS — Z945 Skin transplant status: Secondary | ICD-10-CM | POA: Diagnosis not present

## 2020-01-15 DIAGNOSIS — S82871E Displaced pilon fracture of right tibia, subsequent encounter for open fracture type I or II with routine healing: Secondary | ICD-10-CM | POA: Diagnosis not present

## 2020-01-20 DIAGNOSIS — Z5181 Encounter for therapeutic drug level monitoring: Secondary | ICD-10-CM | POA: Diagnosis not present

## 2020-01-20 DIAGNOSIS — I10 Essential (primary) hypertension: Secondary | ICD-10-CM | POA: Diagnosis not present

## 2020-01-20 DIAGNOSIS — Z79891 Long term (current) use of opiate analgesic: Secondary | ICD-10-CM | POA: Diagnosis not present

## 2020-01-20 DIAGNOSIS — G8918 Other acute postprocedural pain: Secondary | ICD-10-CM | POA: Diagnosis not present

## 2020-01-20 DIAGNOSIS — S82871E Displaced pilon fracture of right tibia, subsequent encounter for open fracture type I or II with routine healing: Secondary | ICD-10-CM | POA: Diagnosis not present

## 2020-01-25 DIAGNOSIS — F4323 Adjustment disorder with mixed anxiety and depressed mood: Secondary | ICD-10-CM | POA: Diagnosis not present

## 2020-01-27 DIAGNOSIS — S82871E Displaced pilon fracture of right tibia, subsequent encounter for open fracture type I or II with routine healing: Secondary | ICD-10-CM | POA: Diagnosis not present

## 2020-01-27 DIAGNOSIS — Z945 Skin transplant status: Secondary | ICD-10-CM | POA: Diagnosis not present

## 2020-02-01 DIAGNOSIS — S82871E Displaced pilon fracture of right tibia, subsequent encounter for open fracture type I or II with routine healing: Secondary | ICD-10-CM | POA: Diagnosis not present

## 2020-02-01 DIAGNOSIS — Z9889 Other specified postprocedural states: Secondary | ICD-10-CM | POA: Diagnosis not present

## 2020-02-01 DIAGNOSIS — R600 Localized edema: Secondary | ICD-10-CM | POA: Diagnosis not present

## 2020-02-01 DIAGNOSIS — S82891D Other fracture of right lower leg, subsequent encounter for closed fracture with routine healing: Secondary | ICD-10-CM | POA: Diagnosis not present

## 2020-02-01 DIAGNOSIS — M85861 Other specified disorders of bone density and structure, right lower leg: Secondary | ICD-10-CM | POA: Diagnosis not present

## 2020-02-24 DIAGNOSIS — Z945 Skin transplant status: Secondary | ICD-10-CM | POA: Diagnosis not present

## 2020-02-24 DIAGNOSIS — S82871E Displaced pilon fracture of right tibia, subsequent encounter for open fracture type I or II with routine healing: Secondary | ICD-10-CM | POA: Diagnosis not present

## 2020-03-07 DIAGNOSIS — S82871E Displaced pilon fracture of right tibia, subsequent encounter for open fracture type I or II with routine healing: Secondary | ICD-10-CM | POA: Diagnosis not present

## 2020-03-14 DIAGNOSIS — M25571 Pain in right ankle and joints of right foot: Secondary | ICD-10-CM | POA: Diagnosis not present

## 2020-03-21 DIAGNOSIS — M25571 Pain in right ankle and joints of right foot: Secondary | ICD-10-CM | POA: Diagnosis not present

## 2020-03-23 DIAGNOSIS — Z945 Skin transplant status: Secondary | ICD-10-CM | POA: Diagnosis not present

## 2020-03-23 DIAGNOSIS — S82871E Displaced pilon fracture of right tibia, subsequent encounter for open fracture type I or II with routine healing: Secondary | ICD-10-CM | POA: Diagnosis not present

## 2020-04-06 DIAGNOSIS — M25571 Pain in right ankle and joints of right foot: Secondary | ICD-10-CM | POA: Diagnosis not present

## 2020-04-06 DIAGNOSIS — T8189XD Other complications of procedures, not elsewhere classified, subsequent encounter: Secondary | ICD-10-CM | POA: Diagnosis not present

## 2020-04-06 DIAGNOSIS — S82871E Displaced pilon fracture of right tibia, subsequent encounter for open fracture type I or II with routine healing: Secondary | ICD-10-CM | POA: Diagnosis not present

## 2020-04-06 DIAGNOSIS — Z9889 Other specified postprocedural states: Secondary | ICD-10-CM | POA: Diagnosis not present

## 2020-04-14 DIAGNOSIS — M25571 Pain in right ankle and joints of right foot: Secondary | ICD-10-CM | POA: Diagnosis not present

## 2020-04-27 ENCOUNTER — Encounter: Payer: Self-pay | Admitting: Physician Assistant

## 2020-04-27 ENCOUNTER — Ambulatory Visit: Payer: Medicaid Other | Admitting: Physician Assistant

## 2020-04-27 ENCOUNTER — Other Ambulatory Visit: Payer: Self-pay

## 2020-04-27 DIAGNOSIS — N760 Acute vaginitis: Secondary | ICD-10-CM

## 2020-04-27 DIAGNOSIS — Z3009 Encounter for other general counseling and advice on contraception: Secondary | ICD-10-CM | POA: Diagnosis not present

## 2020-04-27 DIAGNOSIS — Z113 Encounter for screening for infections with a predominantly sexual mode of transmission: Secondary | ICD-10-CM

## 2020-04-27 DIAGNOSIS — B9689 Other specified bacterial agents as the cause of diseases classified elsewhere: Secondary | ICD-10-CM

## 2020-04-27 DIAGNOSIS — Z0389 Encounter for observation for other suspected diseases and conditions ruled out: Secondary | ICD-10-CM | POA: Diagnosis not present

## 2020-04-27 DIAGNOSIS — Z1388 Encounter for screening for disorder due to exposure to contaminants: Secondary | ICD-10-CM | POA: Diagnosis not present

## 2020-04-27 LAB — WET PREP FOR TRICH, YEAST, CLUE
Trichomonas Exam: NEGATIVE
Yeast Exam: NEGATIVE

## 2020-04-27 MED ORDER — METRONIDAZOLE 500 MG PO TABS: 500.0000 mg | ORAL_TABLET | Freq: Two times a day (BID) | ORAL | 0 refills | Status: AC

## 2020-04-27 NOTE — Progress Notes (Signed)
  [facility] Department STI clinic/screening visit  Subjective:  Sara Morrison is a 30 y.o. female being seen today for an STI screening visit. The patient reports they do have symptoms.  Patient reports that they do not desire a pregnancy in the next year.   They reported they are not interested in discussing contraception today.  Patient's last menstrual period was 05/13/2016.   Patient has the following medical conditions:   Patient Active Problem List   Diagnosis Date Noted  . Hypertension 02/13/2011  . Obesity, unspecified 02/13/2011  . 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 SEXUALLY TRANSMITTED DISEASE    screening    HPI  Patient reports that she has had a vaginal odor for about 2 months.  Denies other symptoms.  States that she is not taking any medicines for HTN.  Reports last HIV test was in 09/2019 and last pap was prior to her hysterectomy.   See flowsheet for further details and programmatic requirements.    The following portions of the patient's history were reviewed and updated as appropriate: allergies, current medications, past medical history, past social history, past surgical history and problem list.  Objective:  There were no vitals filed for this visit.  Physical Exam Constitutional:      General: She is not in acute distress.    Appearance: Normal appearance.  HENT:     Head: Normocephalic and atraumatic.     Mouth/Throat:     Mouth: Mucous membranes are moist.     Pharynx: Oropharynx is clear. No oropharyngeal exudate or posterior oropharyngeal erythema.  Eyes:     Conjunctiva/sclera: Conjunctivae normal.  Pulmonary:     Effort: Pulmonary effort is normal.  Skin:    General: Skin is warm and dry.  Neurological:     Mental Status: She is alert and oriented to person, place, and time.  Psychiatric:        Mood and Affect: Mood normal.        Behavior: Behavior normal.        Thought Content:  Thought content normal.        Judgment: Judgment normal.      Assessment and Plan:  Sara Morrison is a 30 y.o. female presenting to the [facility] Department for STI screening  1. Screening for STD (sexually transmitted disease) Patient into clinic with symptoms. Reviewed with patient wet mount results.  Rec condoms with all sex. Await test results.  Counseled that RN will call if needs to RTC for treatment once results are back. - WET PREP FOR TRICH, YEAST, CLUE - Gonococcus culture - Chlamydia/Gonorrhea [place] [facility] - HIV [place] [facility] - Syphilis Serology, [place] [facility]  2. BV (bacterial vaginosis) Will treat for BV with Metronidazole 500 mg #14 1 po BID for 7 days with food, no EtOH for 24 hr before and until 72 hr after completing medicine. No sex for 14 days. Enc to use OTC antifungal cream if has itching during or just after antibiotic use. - metroNIDAZOLE (FLAGYL) 500 MG tablet; Take 1 tablet (500 mg total) by mouth 2 (two) times daily for 7 days.  Dispense: 14 tablet; Refill: 0     No follow-ups on file.  No future appointments.  Matt Holmes, PA

## 2020-04-29 DIAGNOSIS — M25571 Pain in right ankle and joints of right foot: Secondary | ICD-10-CM | POA: Diagnosis not present

## 2020-04-30 NOTE — Progress Notes (Signed)
Chart reviewed by Pharmacist  Suzanne Silliman PharmD, Contract Pharmacist at [facility]  

## 2020-05-02 LAB — GONOCOCCUS CULTURE

## 2020-05-10 ENCOUNTER — Telehealth: Payer: Self-pay

## 2020-05-10 ENCOUNTER — Encounter: Payer: Self-pay | Admitting: Physician Assistant

## 2020-05-10 NOTE — Telephone Encounter (Signed)
Received message through MyChart. Phone call to pt regarding her MyChart message. Left message on voicemail not sure why she is unable to get results through MyChart, that if she knows her password can give results over the phone or can schedule TR appt and get copies.

## 2020-05-10 NOTE — Telephone Encounter (Signed)
Pt could not access her results for Chlamydia/gonorrhea results, in My Chart.  Passcode confirmed and results given.

## 2020-05-12 DIAGNOSIS — M25571 Pain in right ankle and joints of right foot: Secondary | ICD-10-CM | POA: Diagnosis not present

## 2020-06-06 DIAGNOSIS — S82871M Displaced pilon fracture of right tibia, subsequent encounter for open fracture type I or II with nonunion: Secondary | ICD-10-CM | POA: Diagnosis not present

## 2020-06-06 DIAGNOSIS — Z79899 Other long term (current) drug therapy: Secondary | ICD-10-CM | POA: Diagnosis not present

## 2020-06-06 DIAGNOSIS — S82891D Other fracture of right lower leg, subsequent encounter for closed fracture with routine healing: Secondary | ICD-10-CM | POA: Diagnosis not present

## 2020-07-16 ENCOUNTER — Emergency Department: Payer: Medicaid Other

## 2020-07-16 ENCOUNTER — Encounter: Payer: Self-pay | Admitting: Emergency Medicine

## 2020-07-16 ENCOUNTER — Other Ambulatory Visit: Payer: Self-pay

## 2020-07-16 ENCOUNTER — Emergency Department
Admission: EM | Admit: 2020-07-16 | Discharge: 2020-07-16 | Disposition: A | Discharge status: Home / Self Care | Payer: Medicaid Other | Attending: Emergency Medicine | Admitting: Emergency Medicine

## 2020-07-16 DIAGNOSIS — M7989 Other specified soft tissue disorders: Secondary | ICD-10-CM | POA: Diagnosis not present

## 2020-07-16 DIAGNOSIS — S0181XA Laceration without foreign body of other part of head, initial encounter: Secondary | ICD-10-CM | POA: Diagnosis not present

## 2020-07-16 DIAGNOSIS — R609 Edema, unspecified: Secondary | ICD-10-CM | POA: Insufficient documentation

## 2020-07-16 DIAGNOSIS — Z043 Encounter for examination and observation following other accident: Secondary | ICD-10-CM | POA: Diagnosis not present

## 2020-07-16 DIAGNOSIS — S0083XA Contusion of other part of head, initial encounter: Secondary | ICD-10-CM

## 2020-07-16 DIAGNOSIS — I10 Essential (primary) hypertension: Secondary | ICD-10-CM | POA: Insufficient documentation

## 2020-07-16 DIAGNOSIS — S0993XA Unspecified injury of face, initial encounter: Secondary | ICD-10-CM | POA: Diagnosis not present

## 2020-07-16 DIAGNOSIS — Z79899 Other long term (current) drug therapy: Secondary | ICD-10-CM | POA: Insufficient documentation

## 2020-07-16 DIAGNOSIS — M25571 Pain in right ankle and joints of right foot: Secondary | ICD-10-CM | POA: Diagnosis not present

## 2020-07-16 DIAGNOSIS — S0990XA Unspecified injury of head, initial encounter: Secondary | ICD-10-CM | POA: Diagnosis present

## 2020-07-16 DIAGNOSIS — W108XXA Fall (on) (from) other stairs and steps, initial encounter: Secondary | ICD-10-CM | POA: Insufficient documentation

## 2020-07-16 DIAGNOSIS — Z87891 Personal history of nicotine dependence: Secondary | ICD-10-CM | POA: Diagnosis not present

## 2020-07-16 DIAGNOSIS — S82401A Unspecified fracture of shaft of right fibula, initial encounter for closed fracture: Secondary | ICD-10-CM | POA: Diagnosis not present

## 2020-07-16 DIAGNOSIS — Z91199 Patient's noncompliance with other medical treatment and regimen due to unspecified reason: Secondary | ICD-10-CM

## 2020-07-16 DIAGNOSIS — Z9114 Patient's other noncompliance with medication regimen: Secondary | ICD-10-CM | POA: Diagnosis not present

## 2020-07-16 MED ORDER — LIDOCAINE HCL (PF) 1 % IJ SOLN
5.0000 mL | Freq: Once | INTRAMUSCULAR | Status: AC
  Administered 2020-07-16: 5 mL
  Filled 2020-07-16: qty 5

## 2020-07-16 MED ORDER — LIDOCAINE-EPINEPHRINE-TETRACAINE (LET) TOPICAL GEL
3.0000 mL | Freq: Once | TOPICAL | Status: AC
  Administered 2020-07-16: 3 mL via TOPICAL
  Filled 2020-07-16: qty 3

## 2020-07-16 MED ORDER — ONDANSETRON 4 MG PO TBDP
4.0000 mg | ORAL_TABLET | Freq: Once | ORAL | Status: AC
  Administered 2020-07-16: 4 mg via ORAL
  Filled 2020-07-16: qty 1

## 2020-07-16 MED ORDER — HYDROCHLOROTHIAZIDE 25 MG PO TABS: 25.0000 mg | ORAL_TABLET | Freq: Every day | ORAL | 0 refills | Status: DC

## 2020-07-16 NOTE — ED Triage Notes (Signed)
Pt fell face first down 6 steps. Pt with laceration noted to right forehead with controlled bleeding. Pt denies loc, neck pain or other injuries, but did have ETOH recently. Cms intact to all extremities.

## 2020-07-16 NOTE — Discharge Instructions (Addendum)
Follow-up with your orthopedic surgeon in [facility] for any continued problems with your ankle and foot.  Ice and elevation to reduce swelling and help with pain.  You also need to obtain a primary care provider.  A list of clinics was on your discharge papers including [facility], [facility], and [facility] along with their addresses and phone numbers.  A prescription for hydrochlorothiazide was sent to the pharmacy however most likely it will take more than this 1 medication to control your blood pressure.  Your blood pressure not being treated can result in a heart attack or stroke.  Clean the laceration site daily with mild soap and water and allowed to dry completely.  Suture should be removed in 5 to 7 days.  This can be done at any urgent care.  There were 5 sutures placed.  You may take Tylenol or ibuprofen for pain.

## 2020-07-16 NOTE — ED Notes (Signed)
Rigid c-collar applied.
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
## 2020-07-16 NOTE — ED Notes (Addendum)
Pt talking on personal phone; calm; in NAD. Provider RS notified via secure chat of HTN.

## 2020-07-22 ENCOUNTER — Other Ambulatory Visit: Payer: Self-pay

## 2020-07-22 ENCOUNTER — Ambulatory Visit
Admission: RE | Admit: 2020-07-22 | Discharge: 2020-07-22 | Disposition: A | Discharge status: Home / Self Care | Payer: Medicaid Other | Source: Ambulatory Visit

## 2020-07-22 NOTE — ED Notes (Signed)
Patient states that she does not want to see one of our providers today regarding her hypertension.  Patient states that new BP medication was given to her in the ED and that she is working on establishing with a PCP.

## 2020-07-22 NOTE — ED Triage Notes (Signed)
Patient states that she is here to have sutures removed from her forehead.  Patient states that the sutures were placed last Saturday at [facility] ED.

## 2020-07-27 ENCOUNTER — Emergency Department (EMERGENCY_DEPARTMENT_HOSPITAL)
Admission: EM | Admit: 2020-07-27 | Discharge: 2020-07-28 | Disposition: A | Discharge status: Other Acute Inpatient Hospital | Payer: No Typology Code available for payment source | Source: Home / Self Care | Attending: Emergency Medicine | Admitting: Emergency Medicine

## 2020-07-27 DIAGNOSIS — T1491XA Suicide attempt, initial encounter: Secondary | ICD-10-CM

## 2020-07-27 DIAGNOSIS — T40602A Poisoning by unspecified narcotics, intentional self-harm, initial encounter: Secondary | ICD-10-CM | POA: Diagnosis not present

## 2020-07-27 DIAGNOSIS — Z20822 Contact with and (suspected) exposure to covid-19: Secondary | ICD-10-CM | POA: Insufficient documentation

## 2020-07-27 DIAGNOSIS — F332 Major depressive disorder, recurrent severe without psychotic features: Secondary | ICD-10-CM | POA: Diagnosis present

## 2020-07-27 DIAGNOSIS — F411 Generalized anxiety disorder: Secondary | ICD-10-CM | POA: Diagnosis present

## 2020-07-27 DIAGNOSIS — R0902 Hypoxemia: Secondary | ICD-10-CM | POA: Diagnosis not present

## 2020-07-27 DIAGNOSIS — I10 Essential (primary) hypertension: Secondary | ICD-10-CM | POA: Diagnosis present

## 2020-07-27 DIAGNOSIS — T887XXA Unspecified adverse effect of drug or medicament, initial encounter: Secondary | ICD-10-CM | POA: Diagnosis not present

## 2020-07-27 DIAGNOSIS — R404 Transient alteration of awareness: Secondary | ICD-10-CM | POA: Diagnosis not present

## 2020-07-27 DIAGNOSIS — Z79899 Other long term (current) drug therapy: Secondary | ICD-10-CM | POA: Insufficient documentation

## 2020-07-27 DIAGNOSIS — A6000 Herpesviral infection of urogenital system, unspecified: Secondary | ICD-10-CM | POA: Diagnosis present

## 2020-07-27 DIAGNOSIS — E669 Obesity, unspecified: Secondary | ICD-10-CM | POA: Diagnosis present

## 2020-07-27 DIAGNOSIS — Y904 Blood alcohol level of 80-99 mg/100 ml: Secondary | ICD-10-CM | POA: Insufficient documentation

## 2020-07-27 DIAGNOSIS — T50904A Poisoning by unspecified drugs, medicaments and biological substances, undetermined, initial encounter: Secondary | ICD-10-CM | POA: Diagnosis not present

## 2020-07-27 DIAGNOSIS — T402X2A Poisoning by other opioids, intentional self-harm, initial encounter: Secondary | ICD-10-CM | POA: Insufficient documentation

## 2020-07-27 DIAGNOSIS — Z87891 Personal history of nicotine dependence: Secondary | ICD-10-CM | POA: Insufficient documentation

## 2020-07-27 DIAGNOSIS — F1010 Alcohol abuse, uncomplicated: Secondary | ICD-10-CM | POA: Insufficient documentation

## 2020-07-27 DIAGNOSIS — T50902A Poisoning by unspecified drugs, medicaments and biological substances, intentional self-harm, initial encounter: Secondary | ICD-10-CM

## 2020-07-27 LAB — CBC WITH DIFFERENTIAL/PLATELET
Abs Immature Granulocytes: 0.03 10*3/uL (ref 0.00–0.07)
Basophils Absolute: 0 10*3/uL (ref 0.0–0.1)
Basophils Relative: 0 %
Eosinophils Absolute: 0.1 10*3/uL (ref 0.0–0.5)
Eosinophils Relative: 1 %
HCT: 39.7 % (ref 36.0–46.0)
Hemoglobin: 13.1 g/dL (ref 12.0–15.0)
Immature Granulocytes: 0 %
Lymphocytes Relative: 32 %
Lymphs Abs: 2.6 10*3/uL (ref 0.7–4.0)
MCH: 29.2 pg (ref 26.0–34.0)
MCHC: 33 g/dL (ref 30.0–36.0)
MCV: 88.6 fL (ref 80.0–100.0)
Monocytes Absolute: 0.4 10*3/uL (ref 0.1–1.0)
Monocytes Relative: 5 %
Neutro Abs: 5 10*3/uL (ref 1.7–7.7)
Neutrophils Relative %: 62 %
Platelets: 217 10*3/uL (ref 150–400)
RBC: 4.48 MIL/uL (ref 3.87–5.11)
RDW: 13.2 % (ref 11.5–15.5)
WBC: 8.1 10*3/uL (ref 4.0–10.5)
nRBC: 0 % (ref 0.0–0.2)

## 2020-07-27 LAB — COMPREHENSIVE METABOLIC PANEL
ALT: 15 U/L (ref 0–44)
AST: 13 U/L — ABNORMAL LOW (ref 15–41)
Albumin: 4.1 g/dL (ref 3.5–5.0)
Alkaline Phosphatase: 45 U/L (ref 38–126)
Anion gap: 13 (ref 5–15)
BUN: 12 mg/dL (ref 6–20)
CO2: 24 mmol/L (ref 22–32)
Calcium: 9.5 mg/dL (ref 8.9–10.3)
Chloride: 102 mmol/L (ref 98–111)
Creatinine, Ser: 0.81 mg/dL (ref 0.44–1.00)
GFR, Estimated: >60 mL/min (ref >60)
Glucose, Bld: 185 mg/dL — ABNORMAL HIGH (ref 70–99)
Potassium: 3.3 mmol/L — ABNORMAL LOW (ref 3.5–5.1)
Sodium: 139 mmol/L (ref 135–145)
Total Bilirubin: 0.4 mg/dL (ref 0.3–1.2)
Total Protein: 8.1 g/dL (ref 6.5–8.1)

## 2020-07-27 LAB — POC URINE PREG, ED: Preg Test, Ur: NEGATIVE

## 2020-07-27 LAB — ACETAMINOPHEN LEVEL: Acetaminophen (Tylenol), Serum: <10 ug/mL — ABNORMAL LOW (ref 10–30)

## 2020-07-27 LAB — ETHANOL: Alcohol, Ethyl (B): 94 mg/dL — ABNORMAL HIGH (ref <10)

## 2020-07-27 LAB — SALICYLATE LEVEL: Salicylate Lvl: <7 mg/dL — ABNORMAL LOW (ref 7.0–30.0)
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
## 2020-07-27 NOTE — ED Triage Notes (Signed)
Pt coming from home with a possible medication/ Drug overdose. Per EMS pt mother found pt unresponsive, mother advised pt may have ingested an unknown amount of alcohol, muscle relaxer and pain medication.  EMS advised they gave 2 rounds of Narcan intranasal which improved pt respirations.

## 2020-07-28 ENCOUNTER — Inpatient Hospital Stay
Admission: AD | Admit: 2020-07-28 | Discharge: 2020-08-01 | DRG: 885 | Disposition: A | Discharge status: Home / Self Care | Payer: No Typology Code available for payment source | Source: Intra-hospital | Attending: Behavioral Health | Admitting: Behavioral Health

## 2020-07-28 ENCOUNTER — Other Ambulatory Visit: Payer: Self-pay

## 2020-07-28 DIAGNOSIS — G4700 Insomnia, unspecified: Secondary | ICD-10-CM | POA: Diagnosis present

## 2020-07-28 DIAGNOSIS — F332 Major depressive disorder, recurrent severe without psychotic features: Principal | ICD-10-CM | POA: Diagnosis present

## 2020-07-28 DIAGNOSIS — Z79899 Other long term (current) drug therapy: Secondary | ICD-10-CM | POA: Diagnosis not present

## 2020-07-28 DIAGNOSIS — Z635 Disruption of family by separation and divorce: Secondary | ICD-10-CM | POA: Diagnosis not present

## 2020-07-28 DIAGNOSIS — I10 Essential (primary) hypertension: Secondary | ICD-10-CM | POA: Diagnosis present

## 2020-07-28 DIAGNOSIS — Y904 Blood alcohol level of 80-99 mg/100 ml: Secondary | ICD-10-CM | POA: Diagnosis present

## 2020-07-28 DIAGNOSIS — Z833 Family history of diabetes mellitus: Secondary | ICD-10-CM | POA: Diagnosis not present

## 2020-07-28 DIAGNOSIS — Z9851 Tubal ligation status: Secondary | ICD-10-CM

## 2020-07-28 DIAGNOSIS — F1210 Cannabis abuse, uncomplicated: Secondary | ICD-10-CM | POA: Diagnosis present

## 2020-07-28 DIAGNOSIS — Z6841 Body Mass Index (BMI) 40.0 and over, adult: Secondary | ICD-10-CM | POA: Diagnosis not present

## 2020-07-28 DIAGNOSIS — F411 Generalized anxiety disorder: Secondary | ICD-10-CM | POA: Diagnosis not present

## 2020-07-28 DIAGNOSIS — Z87891 Personal history of nicotine dependence: Secondary | ICD-10-CM | POA: Diagnosis not present

## 2020-07-28 DIAGNOSIS — T40602A Poisoning by unspecified narcotics, intentional self-harm, initial encounter: Secondary | ICD-10-CM | POA: Diagnosis not present

## 2020-07-28 DIAGNOSIS — F1010 Alcohol abuse, uncomplicated: Secondary | ICD-10-CM | POA: Diagnosis present

## 2020-07-28 DIAGNOSIS — Z803 Family history of malignant neoplasm of breast: Secondary | ICD-10-CM

## 2020-07-28 DIAGNOSIS — Z825 Family history of asthma and other chronic lower respiratory diseases: Secondary | ICD-10-CM | POA: Diagnosis not present

## 2020-07-28 DIAGNOSIS — Z20822 Contact with and (suspected) exposure to covid-19: Secondary | ICD-10-CM | POA: Diagnosis not present

## 2020-07-28 DIAGNOSIS — Z801 Family history of malignant neoplasm of trachea, bronchus and lung: Secondary | ICD-10-CM

## 2020-07-28 DIAGNOSIS — A6009 Herpesviral infection of other urogenital tract: Secondary | ICD-10-CM | POA: Diagnosis not present

## 2020-07-28 DIAGNOSIS — E669 Obesity, unspecified: Secondary | ICD-10-CM | POA: Diagnosis present

## 2020-07-28 LAB — URINE DRUG SCREEN, QUALITATIVE (ARMC ONLY)
Amphetamines, Ur Screen: NOT DETECTED
Barbiturates, Ur Screen: NOT DETECTED
Benzodiazepine, Ur Scrn: NOT DETECTED
Cannabinoid 50 Ng, Ur ~~LOC~~: POSITIVE — AB
Cocaine Metabolite,Ur ~~LOC~~: NOT DETECTED
MDMA (Ecstasy)Ur Screen: NOT DETECTED
Methadone Scn, Ur: NOT DETECTED
Opiate, Ur Screen: NOT DETECTED
Phencyclidine (PCP) Ur S: NOT DETECTED
Tricyclic, Ur Screen: NOT DETECTED

## 2020-07-28 LAB — RESP PANEL BY RT-PCR (FLU A&B, COVID) ARPGX2
Influenza A by PCR: NEGATIVE
Influenza B by PCR: NEGATIVE
SARS Coronavirus 2 by RT PCR: NEGATIVE

## 2020-07-28 MED ORDER — HYDROCHLOROTHIAZIDE 25 MG PO TABS
25.0000 mg | ORAL_TABLET | Freq: Every day | ORAL | Status: DC
  Administered 2020-07-28 – 2020-07-29 (×2): 25 mg via ORAL
  Filled 2020-07-28 (×3): qty 1

## 2020-07-28 MED ORDER — ALUM & MAG HYDROXIDE-SIMETH 200-200-20 MG/5ML PO SUSP: 30.0000 mL | ORAL | Status: DC | PRN

## 2020-07-28 MED ORDER — MAGNESIUM HYDROXIDE 400 MG/5ML PO SUSP
30.0000 mL | Freq: Every day | ORAL | Status: DC | PRN
  Administered 2020-07-29 – 2020-07-30 (×2): 30 mL via ORAL
  Filled 2020-07-28 (×2): qty 30

## 2020-07-28 MED ORDER — HYDRALAZINE HCL 25 MG PO TABS
25.0000 mg | ORAL_TABLET | Freq: Four times a day (QID) | ORAL | Status: DC | PRN
  Administered 2020-07-28 – 2020-08-01 (×4): 25 mg via ORAL
  Filled 2020-07-28 (×6): qty 1

## 2020-07-28 MED ORDER — HYDRALAZINE HCL 10 MG PO TABS
10.0000 mg | ORAL_TABLET | Freq: Four times a day (QID) | ORAL | Status: DC | PRN
  Administered 2020-07-28 (×2): 10 mg via ORAL
  Filled 2020-07-28 (×4): qty 1

## 2020-07-28 MED ORDER — ACETAMINOPHEN 325 MG PO TABS
650.0000 mg | ORAL_TABLET | Freq: Four times a day (QID) | ORAL | Status: DC | PRN
  Administered 2020-07-31: 650 mg via ORAL
  Filled 2020-07-28: qty 2
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
## 2020-07-28 NOTE — ED Notes (Signed)
Pt given phone for phone privileges.

## 2020-07-28 NOTE — ED Notes (Signed)
Attempted to call report to [facility] RN, Aundra Millet, for admission report to be given.  BMU unwilling to take report at this time. This RN asked estimated time for report to be able to be called to [facility], and RN unable to give time and states they will call us back when ready to receive report.
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
## 2020-07-28 NOTE — ED Notes (Signed)
Hourly rounding performed, patient currently awake in hallway bed. Patient has no complaints at this time. Q15 minute rounds and monitoring via Rover and Officer to continue. 
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
## 2020-07-28 NOTE — ED Notes (Signed)
Hourly rounding performed, patient currently asleep in hallway bed. Patient has no complaints at this time. Q15 minute rounds and monitoring via Rover and Officer to continue. 
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
## 2020-07-28 NOTE — ED Notes (Signed)
Belongings collected and put in belongings bag. Items collected were shirt, bra, shorts, and one sock.

## 2020-07-28 NOTE — Progress Notes (Addendum)
Sara Morrison, patient, DECLINED to provide written consent for CSW team to coordinate aftercare at this time. CSW to have further conversations with patient regarding care in the community.   Patient provided written consent to reach her grandmother, Francesca Oman, at [phone] for collateral information and SPE.  Signed:  Corky Crafts, MSW, [place], LCASA 07/28/2020 7:21 PM

## 2020-07-28 NOTE — Progress Notes (Signed)
Recreation Therapy Notes  INPATIENT RECREATION TR PLAN  Patient Details Name: KAYDEN AMEND MRN: [id number] DOB: Mar 12, 1990 Today's Date: 07/28/2020  Rec Therapy Plan Is patient appropriate for Therapeutic Recreation?: Yes Treatment times per week: at least 3 Estimated Length of Stay: 5-7 days TR Treatment/Interventions: Group participation (Comment)  Discharge Criteria Pt will be discharged from therapy if:: Discharged Treatment plan/goals/alternatives discussed and agreed upon by:: Patient/family  Discharge Summary     Jalila Goodnough 07/28/2020, 3:14 PM

## 2020-07-28 NOTE — Progress Notes (Signed)
Pt denies depression, anxiety, SI, HI and AVH. Pt states "I'm really fine".She took a shower, has been on the phone, ate dinner and watched tv in the day room. She has been med compliant and calm. She received a PRN twice regarding her blood pressure. MD aware of both readings. New order made. Pt has no complaints.  Torrie Mayers RN

## 2020-07-28 NOTE — B[facility] Group Notes (Signed)
LCSW Group Therapy Note  07/28/2020 1:40 PM  Type of Therapy/Topic:  Group Therapy:  Balance in Life  Participation Level:  Did Not Attend  Description of Group:    This group will address the concept of balance and how it feels and looks when one is unbalanced. Patients will be encouraged to process areas in their lives that are out of balance and identify reasons for remaining unbalanced. Facilitators will guide patients in utilizing problem-solving interventions to address and correct the stressor making their life unbalanced. Understanding and applying boundaries will be explored and addressed for obtaining and maintaining a balanced life. Patients will be encouraged to explore ways to assertively make their unbalanced needs known to significant others in their lives, using other group members and facilitator for support and feedback.  Therapeutic Goals: Patient will identify two or more emotions or situations they have that consume much of in their lives. Patient will identify signs/triggers that life has become out of balance:  Patient will identify two ways to set boundaries in order to achieve balance in their lives:  Patient will demonstrate ability to communicate their needs through discussion and/or role plays  Summary of Patient Progress: Patient did not attend group despite encouraged participation.    Therapeutic Modalities:   Cognitive Behavioral Therapy Solution-Focused Therapy Assertiveness Training  Keyshaun Exley, MSW, LCSWA, LCASA 07/28/2020 1:40 PM 
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
## 2020-07-28 NOTE — Progress Notes (Signed)
This Clinical research associate contacted the ED secretary to inform them that we had an agitated patient situation on the BMU, that prevented the nurses from accept the patient at that time.
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
## 2020-07-28 NOTE — BH Assessment (Signed)
PATIENT BED AVAILABLE 07/28/20 AFTER 8:00AM  Patient is to be admitted to [facility] by Psychiatric Nurse Practitioner Gillermo Murdoch.  Attending Physician will be Dr. Neale Burly.   Patient has been assigned to room 301, by [facility] Charge Nurse [place].    ER staff is aware of the admission: [facility] ER Secretary   Dr. York Cerise, ER MD  Thayer Ohm Patient's Nurse  Rosey Bath Patient Access.

## 2020-07-29 MED ORDER — HYDROCHLOROTHIAZIDE 50 MG PO TABS
50.0000 mg | ORAL_TABLET | Freq: Every day | ORAL | Status: DC
  Administered 2020-07-30 – 2020-08-01 (×3): 50 mg via ORAL
  Filled 2020-07-29: qty 2
  Filled 2020-07-29 (×2): qty 1
  Filled 2020-07-29 (×2): qty 2
  Filled 2020-07-29: qty 1

## 2020-07-29 MED ORDER — AMLODIPINE BESYLATE 5 MG PO TABS
5.0000 mg | ORAL_TABLET | Freq: Every day | ORAL | Status: DC
  Administered 2020-07-29 – 2020-07-30 (×2): 5 mg via ORAL
  Filled 2020-07-29 (×2): qty 1

## 2020-07-29 NOTE — Tx Team (Signed)
Interdisciplinary Treatment and Diagnostic Plan Update  07/29/2020 Time of Session: 9:00 AM Sara Morrison MRN: [id number]  Principal Diagnosis: MDD (major depressive disorder), recurrent episode, severe ([place])  Secondary Diagnoses: Principal Problem:   MDD (major depressive disorder), recurrent episode, severe ([place]) Active Problems:   Hypertension   GAD (generalized anxiety disorder)   Current Medications:  Current Facility-Administered Medications  Medication Dose Route Frequency Provider Last Rate Last Admin   acetaminophen (TYLENOL) tablet 650 mg  650 mg Oral Q6H PRN Caroline Sauger, NP       alum & mag hydroxide-simeth (MAALOX/MYLANTA) 200-200-20 MG/5ML suspension 30 mL  30 mL Oral Q4H PRN Caroline Sauger, NP       hydrALAZINE (APRESOLINE) tablet 25 mg  25 mg Oral Q6H PRN Salley Scarlet, MD   25 mg at 07/29/20 2751   hydrochlorothiazide (HYDRODIURIL) tablet 25 mg  25 mg Oral Daily Salley Scarlet, MD   25 mg at 07/29/20 0816   magnesium hydroxide (MILK OF MAGNESIA) suspension 30 mL  30 mL Oral Daily PRN Caroline Sauger, NP   30 mL at 07/29/20 0818   PTA Medications: Medications Prior to Admission  Medication Sig Dispense Refill Last Dose   hydrochlorothiazide (HYDRODIURIL) 25 MG tablet Take 25 mg by mouth daily. (Patient not taking: Reported on 07/28/2020)   Not Taking    Patient Stressors:    Patient Strengths:    Treatment Modalities: Medication Management, Group therapy, Case management,  1 to 1 session with clinician, Psychoeducation, Recreational therapy.   Physician Treatment Plan for Primary Diagnosis: MDD (major depressive disorder), recurrent episode, severe ([place]) Long Term Goal(s): Improvement in symptoms so as ready for discharge   Short Term Goals: Ability to identify changes in lifestyle to reduce recurrence of condition will improve Ability to verbalize feelings will improve Ability to disclose and discuss suicidal ideas Ability to  demonstrate self-control will improve Ability to identify and develop effective coping behaviors will improve Ability to maintain clinical measurements within normal limits will improve Compliance with prescribed medications will improve Ability to identify triggers associated with substance abuse/mental health issues will improve  Medication Management: Evaluate patient's response, side effects, and tolerance of medication regimen.  Therapeutic Interventions: 1 to 1 sessions, Unit Group sessions and Medication administration.  Evaluation of Outcomes: Not Met  Physician Treatment Plan for Secondary Diagnosis: Principal Problem:   MDD (major depressive disorder), recurrent episode, severe ([place]) Active Problems:   Hypertension   GAD (generalized anxiety disorder)  Long Term Goal(s): Improvement in symptoms so as ready for discharge   Short Term Goals: Ability to identify changes in lifestyle to reduce recurrence of condition will improve Ability to verbalize feelings will improve Ability to disclose and discuss suicidal ideas Ability to demonstrate self-control will improve Ability to identify and develop effective coping behaviors will improve Ability to maintain clinical measurements within normal limits will improve Compliance with prescribed medications will improve Ability to identify triggers associated with substance abuse/mental health issues will improve     Medication Management: Evaluate patient's response, side effects, and tolerance of medication regimen.  Therapeutic Interventions: 1 to 1 sessions, Unit Group sessions and Medication administration.  Evaluation of Outcomes: Not Met   RN Treatment Plan for Primary Diagnosis: MDD (major depressive disorder), recurrent episode, severe ([place]) Long Term Goal(s): Knowledge of disease and therapeutic regimen to maintain health will improve  Short Term Goals: Ability to remain free from injury will improve, Ability to  verbalize frustration and anger appropriately will improve, Ability  to verbalize feelings will improve, Ability to disclose and discuss suicidal ideas, Ability to identify and develop effective coping behaviors will improve, and Compliance with prescribed medications will improve  Medication Management: RN will administer medications as ordered by provider, will assess and evaluate patient's response and provide education to patient for prescribed medication. RN will report any adverse and/or side effects to prescribing provider.  Therapeutic Interventions: 1 on 1 counseling sessions, Psychoeducation, Medication administration, Evaluate responses to treatment, Monitor vital signs and CBGs as ordered, Perform/monitor CIWA, COWS, AIMS and Fall Risk screenings as ordered, Perform wound care treatments as ordered.  Evaluation of Outcomes: Not Met   LCSW Treatment Plan for Primary Diagnosis: MDD (major depressive disorder), recurrent episode, severe ([place]) Long Term Goal(s): Safe transition to appropriate next level of care at discharge, Engage patient in therapeutic group addressing interpersonal concerns.  Short Term Goals: Engage patient in aftercare planning with referrals and resources, Increase social support, Increase ability to appropriately verbalize feelings, Increase emotional regulation, Facilitate acceptance of mental health diagnosis and concerns, Identify triggers associated with mental health/substance abuse issues, and Increase skills for wellness and recovery  Therapeutic Interventions: Assess for all discharge needs, 1 to 1 time with Social worker, Explore available resources and support systems, Assess for adequacy in community support network, Educate family and significant other(s) on suicide prevention, Complete Psychosocial Assessment, Interpersonal group therapy.  Evaluation of Outcomes: Not Met   Progress in Treatment: Attending groups: No. Participating in groups:  No. Taking medication as prescribed: Yes. Toleration medication: Yes. Family/Significant other contact made: No, will contact:  when given permission. Patient understands diagnosis: No. Discussing patient identified problems/goals with staff: Yes. Medical problems stabilized or resolved: Yes. Denies suicidal/homicidal ideation: Yes. Issues/concerns per patient self-inventory: No. Other: None.  New problem(s) identified: No, Describe:  none.  New Short Term/Long Term Goal(s): medication management for mood stabilization; elimination of SI thoughts; development of comprehensive mental wellness plan.  Patient Goals: "Just staying with a clear mind."   Discharge Plan or Barriers: CSW will assist pt with development of an appropriate aftercare/discharge plan.   Reason for Continuation of Hospitalization: Anxiety Depression Medication stabilization Suicidal ideation  Estimated Length of Stay: 1-7 days  Attendees: Patient: Sara Morrison 07/29/2020 9:08 AM  Physician: Selina Cooley, MD 07/29/2020 9:08 AM  Nursing: Collier Bullock, RN 07/29/2020 9:08 AM  RN Care Manager: 07/29/2020 9:08 AM  Social Worker: Chalmers Guest. Guerry Bruin, MSW, [place], [place] 07/29/2020 9:08 AM  Recreational Therapist: Devin Going, LRT  07/29/2020 9:08 AM  Other: Kiva Martinique, MSW, LCSW-A 07/29/2020 9:08 AM  Other: Paulla Dolly, MSW, [place], [place] 07/29/2020 9:08 AM  Other: 07/29/2020 9:08 AM    Scribe for Treatment Team: Shirl Harris, LCSW 07/29/2020 9:08 AM

## 2020-07-29 NOTE — BHH Group Notes (Signed)
BHH Group Notes:  (Nursing/MHT/Case Management/Adjunct)  Date:  07/29/2020  Time:  8:43 PM  Type of Therapy:  Group Therapy  Participation Level:  Active  Participation Quality:  Appropriate  Affect:  Appropriate  Cognitive:  Alert  Insight:  Good  Engagement in Group:  Goal is to stay calm  Modes of Intervention:  Activity  Summary of Progress/Problems:  Sara Morrison 07/29/2020, 8:43 PM

## 2020-07-29 NOTE — Progress Notes (Signed)
Patient was visible in them milieu during the evening, she was cooperative with treatment, she remains sad, but she denies SI/HI &AVH. Blood pressure elevated and given PRN with effect. Patient in bed resting quietly at this time.
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
## 2020-07-29 NOTE — [facility] Group Notes (Signed)
LCSW Group Therapy Note     07/29/2020 3:01 PM  Type of Therapy and Topic:  Group Therapy:  Feelings around Relapse and Recovery  Participation Level:  Active  Description of Group:    Patients in this group will discuss emotions they experience before and after a relapse. They will process how experiencing these feelings, or avoidance of experiencing them, relates to having a relapse. Facilitator will guide patients to explore emotions they have related to recovery. Patients will be encouraged to process which emotions are more powerful. They will be guided to discuss the emotional reaction significant others in their lives may have to their relapse or recovery. Patients will be assisted in exploring ways to respond to the emotions of others without this contributing to a relapse.     Therapeutic Goals:  1.    Patient will identify two or more emotions that lead to a relapse for them 2.    Patient will identify two emotions that result when they relapse 3.    Patient will identify two emotions related to recovery 4.    Patient will demonstrate ability to communicate their needs through discussion and/or role plays    Summary of Patient Progress: Patient was present for the entirety of the group session. Patient was an active listener and participated in the topic of discussion, provided helpful advice to others, and added nuance to topic of conversation. Patient was helpful in redirecting other patients who were treatment resistant. Patient showed incite in remarks.     Therapeutic Modalities:  Cognitive Behavioral Therapy Solution-Focused Therapy Assertiveness Training Relapse Prevention Therapy    Gwenevere Ghazi, LCSW, LCAS-A  07/29/2020 3:01 PM

## 2020-07-29 NOTE — Progress Notes (Signed)
Recreation Therapy Notes    Date: 07/29/2020  Time: 9:45 am  Location: Courtyard     Behavioral response: Appropriate   Intervention Topic: Leisure    Discussion/Intervention:  Group content today was focused on leisure. The group defined what leisure is and some positive leisure activities they participate in. Individuals identified the difference between good and bad leisure. Participants expressed how they feel after participating in the leisure of their choice. The group discussed how they go about picking a leisure activity and if others are involved in their leisure activities. The patient stated how many leisure activities they too choose from and reasons why it is important to have leisure time. Individuals participated in the intervention "Exploring Leisure" where they had a chance to identify new leisure activities as well as benefits of leisure. Clinical Observations/Feedback:  Patient came to group late and participated in leisure actives. Individual was social with peers and staff while participating in the intervention.     Tresten Pantoja [name]         Nicoya Friel 07/29/2020 11:28 AM

## 2020-07-29 NOTE — Progress Notes (Signed)
D: Pt alert and oriented. Pt rates depression 0/10, hopelessness 0/10, and anxiety 0/10. Pt goal: "Continuing keeping my mental clear and calm." Pt reports energy level as normal and concentration as being good. Pt reports sleep last night as being good. Pt did not receive medications for sleep. Pt denies experiencing any pain at this time. Pt denies experiencing any SI/HI, or AVH at this time.   A: Scheduled medications administered to pt, per MD orders. Support and encouragement provided. Frequent verbal contact made. Routine safety checks conducted q15 minutes.   R: No adverse drug reactions noted. Pt verbally contracts for safety at this time. Pt complaint with medications and treatment plan. Pt interacts well with others on the unit. Pt remains safe at this time. Will continue to monitor.

## 2020-07-29 NOTE — [facility] Suicide Risk Assessment (Signed)
[facility] INPATIENT:  Family/Significant Other Suicide Prevention Education  Suicide Prevention Education:  Education Completed; Denice,  Database administrator) has been identified by the patient as the family member/significant other with whom the patient will be residing, and identified as the person(s) who will aid the patient in the event of a mental health crisis (suicidal ideations/suicide attempt).  With written consent from the patient, the family member/significant other has been provided the following suicide prevention education, prior to the and/or following the discharge of the patient.  The suicide prevention education provided includes the following: Suicide risk factors Suicide prevention and interventions National Suicide Hotline telephone number [facility] assessment telephone number [facility] Emergency Assistance 911 [facility] and/or Residential Mobile Crisis Unit telephone number  Request made of family/significant other to: Remove weapons (e.g., guns, rifles, knives), all items previously/currently identified as safety concern.   Remove drugs/medications (over-the-counter, prescriptions, illicit drugs), all items previously/currently identified as a safety concern.  The family member/significant other verbalizes understanding of the suicide prevention education information provided.  The family member/significant other agrees to remove the items of safety concern listed above.  Corky Crafts 07/29/2020, 9:59 AM

## 2020-07-30 DIAGNOSIS — F332 Major depressive disorder, recurrent severe without psychotic features: Principal | ICD-10-CM

## 2020-07-30 MED ORDER — AMLODIPINE BESYLATE 5 MG PO TABS
10.0000 mg | ORAL_TABLET | Freq: Every day | ORAL | Status: DC
  Administered 2020-07-31 – 2020-08-01 (×2): 10 mg via ORAL
  Filled 2020-07-30 (×3): qty 2

## 2020-07-30 NOTE — Progress Notes (Signed)
Patient has been calm and cooperative. Denies SI, HI and AVH 

## 2020-07-30 NOTE — Progress Notes (Signed)
D: Pt alert and oriented. Pt rates depression 0/10, hopelessness 0/10, and anxiety 0/10. Pt goal: "Getting info about therapist and choosing one. Continue staying calm and at peace." Pt reports energy level as normal and concentration as being good. Pt reports sleep last night as being good. Pt did not receive medications for sleep. Pt reports experiencing some upper body pain from using the [place] rated 3/10, pt refuses pain management meds. Pt has requested a wheelchair that she can use at times. MD notified of this request, orders were place however no wheelchair has been brought down yet. Pt has also contacted her mother and husband to see if one of them can bring her boot and an order can be placed for the pt to be able to wear it. Pt denies experiencing any SI/HI, or AVH at this time.   A: Scheduled medications administered to pt, per MD orders. Support and encouragement provided. Frequent verbal contact made. Routine safety checks conducted q15 minutes.   R: No adverse drug reactions noted. Pt verbally contracts for safety at this time. Pt complaint with medications and treatment plan. Pt interacts well with others on the unit. Pt remains safe at this time. Will continue to monitor.

## 2020-07-30 NOTE — Plan of Care (Signed)
  Problem: Education: Goal: Utilization of techniques to improve thought processes will improve Outcome: Progressing Goal: Knowledge of the prescribed therapeutic regimen will improve Outcome: Progressing   Problem: Activity: Goal: Interest or engagement in leisure activities will improve Outcome: Progressing Goal: Imbalance in normal sleep/wake cycle will improve Outcome: Progressing   Problem: Coping: Goal: Coping ability will improve Outcome: Progressing Goal: Will verbalize feelings Outcome: Progressing   Problem: Health Behavior/Discharge Planning: Goal: Ability to make decisions will improve Outcome: Progressing Goal: Compliance with therapeutic regimen will improve Outcome: Progressing   Problem: Role Relationship: Goal: Will demonstrate positive changes in social behaviors and relationships Outcome: Progressing   Problem: Safety: Goal: Ability to disclose and discuss suicidal ideas will improve Outcome: Progressing Goal: Ability to identify and utilize support systems that promote safety will improve Outcome: Progressing   Problem: Self-Concept: Goal: Will verbalize positive feelings about self Outcome: Progressing Goal: Level of anxiety will decrease Outcome: Progressing   Problem: Education: Goal: Knowledge of [place] [facility] Education information/materials will improve Outcome: Progressing Goal: Emotional status will improve Outcome: Progressing Goal: Mental status will improve Outcome: Progressing Goal: Verbalization of understanding the information provided will improve Outcome: Progressing   Problem: Activity: Goal: Interest or engagement in activities will improve Outcome: Progressing Goal: Sleeping patterns will improve Outcome: Progressing   Problem: Health Behavior/Discharge Planning: Goal: Identification of resources available to assist in meeting health care needs will improve Outcome: Progressing Goal: Compliance with treatment plan  for underlying cause of condition will improve Outcome: Progressing   Problem: Physical Regulation: Goal: Ability to maintain clinical measurements within normal limits will improve Outcome: Progressing   Problem: Safety: Goal: Periods of time without injury will increase Outcome: Progressing

## 2020-07-30 NOTE — [facility] Group Notes (Signed)
LCSW Group Therapy Note  07/30/2020 2:38 PM  Type of Therapy and Topic:  Group Therapy: Avoiding Self-Sabotaging and Enabling Behaviors  Participation Level:  Active   Description of Group:   In this group, patients will learn how to identify obstacles, self-sabotaging and enabling behaviors, as well as: what are they, why do we do them and what needs these behaviors meet. Discuss unhealthy relationships and how to have positive healthy boundaries with those that sabotage and enable. Explore aspects of self-sabotage and enabling in yourself and how to limit these self-destructive behaviors in everyday life.   Therapeutic Goals: Patient will identify one obstacle that relates to self-sabotage and enabling behaviors Patient will identify one personal self-sabotaging or enabling behavior they did prior to admission Patient will state a plan to change the above identified behavior Patient will demonstrate ability to communicate their needs through discussion and/or role play.   Summary of Patient Progress: Patient was present for the remaining 15 minutes of group. However, for the time that the patient was present she was an active listener and participated in the topic of discussion, provided helpful advice to others, and added nuance to topic of conversation.     Therapeutic Modalities:   Cognitive Behavioral Therapy Person-Centered Therapy Motivational Interviewing   Gwenevere Ghazi, MSW, [place], [place] 07/30/2020 2:38 PM
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
## 2020-07-31 MED ORDER — ESCITALOPRAM OXALATE 10 MG PO TABS
5.0000 mg | ORAL_TABLET | Freq: Every day | ORAL | Status: DC
  Administered 2020-07-31 – 2020-08-01 (×2): 5 mg via ORAL
  Filled 2020-07-31 (×2): qty 1

## 2020-07-31 MED ORDER — LISINOPRIL 5 MG PO TABS
10.0000 mg | ORAL_TABLET | Freq: Every day | ORAL | Status: DC
  Administered 2020-07-31 – 2020-08-01 (×2): 10 mg via ORAL
  Filled 2020-07-31: qty 2

## 2020-07-31 NOTE — Progress Notes (Signed)
CSW revisited topic of aftercare with patient who shares that she is willing to follow up with community mental health after discharge while looking for a suitable therapist. Patient shared, without solicitation, that she wanted to see a therapist prior to upcoming surgery. Patient has [facility] and [facility] discharge. CSW to obtain consent for release of information.   Signed:  Corky Crafts, MSW, [place], LCASA 07/31/2020 2:48 PM

## 2020-07-31 NOTE — Progress Notes (Signed)
Patient is cooperative and pleasant.  She did attend group session.  Discussed leaving so she can get back home to her children.  She has been complaining of pain in upper body from using the [place].  Tylenol was given.  Her BP did run high today. She denies SI and HI.

## 2020-07-31 NOTE — Plan of Care (Signed)
  Problem: Education: Goal: Utilization of techniques to improve thought processes will improve Outcome: Progressing Goal: Knowledge of the prescribed therapeutic regimen will improve Outcome: Progressing   Problem: Activity: Goal: Interest or engagement in leisure activities will improve Outcome: Progressing Goal: Imbalance in normal sleep/wake cycle will improve Outcome: Progressing   Problem: Coping: Goal: Coping ability will improve Outcome: Progressing Goal: Will verbalize feelings Outcome: Progressing   Problem: Health Behavior/Discharge Planning: Goal: Ability to make decisions will improve Outcome: Progressing Goal: Compliance with therapeutic regimen will improve Outcome: Progressing   Problem: Role Relationship: Goal: Will demonstrate positive changes in social behaviors and relationships Outcome: Progressing   Problem: Safety: Goal: Ability to disclose and discuss suicidal ideas will improve Outcome: Progressing Goal: Ability to identify and utilize support systems that promote safety will improve Outcome: Progressing   Problem: Self-Concept: Goal: Will verbalize positive feelings about self Outcome: Progressing Goal: Level of anxiety will decrease Outcome: Progressing   Problem: Education: Goal: Knowledge of [place] [facility] Education information/materials will improve Outcome: Progressing Goal: Emotional status will improve Outcome: Progressing Goal: Mental status will improve Outcome: Progressing Goal: Verbalization of understanding the information provided will improve Outcome: Progressing   Problem: Activity: Goal: Interest or engagement in activities will improve Outcome: Progressing Goal: Sleeping patterns will improve Outcome: Progressing   Problem: Health Behavior/Discharge Planning: Goal: Identification of resources available to assist in meeting health care needs will improve Outcome: Progressing Goal: Compliance with treatment plan  for underlying cause of condition will improve Outcome: Progressing   Problem: Physical Regulation: Goal: Ability to maintain clinical measurements within normal limits will improve Outcome: Progressing   Problem: Safety: Goal: Periods of time without injury will increase Outcome: Progressing   
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
## 2020-07-31 NOTE — Progress Notes (Signed)
Patient has spent most of her time on the phone or in the day room. Affect is sad. Has not received her wheelchair yet. Denies SI, HI and AVH

## 2020-07-31 NOTE — [facility] Group Notes (Signed)
LCSW Group Therapy Note  07/31/2020 2:21 PM  Type of Therapy and Topic:  Group Therapy:  Creating Healthy Boundaries  Participation Level:  Active   Description of Group:   Patients in this group will discuss how creating boundaries supports mental health wellness and sobriety. Participants will consider examples of both healthy and unhealthy boundaries and potential consequences of both. Participants are asked to share how they have implemented healthy boundaries in the past and encouraged to create new boundaries to implement in their lives post hospitalization.   Therapeutic Goals: Patient will identify two or more emotions that lead to a relapse for them Patient will identify two emotions that result when they relapse Patient will identify two emotions related to recovery Patient will demonstrate ability to communicate their needs through discussion and/or role plays   Summary of Patient Progress: Patient was present for the entirety of the group session. Patient was an active listener and participated in the topic of discussion, provided helpful advice to others, and added nuance to topic of conversation. Patient is preoccupied with discharge, reports she is willing to follow-up with community mental health after discharge.     Therapeutic Modalities: Cognitive Behavioral Therapy Solution-Focused Therapy Assertiveness Training Relapse Prevention Therapy   Gwenevere Ghazi, MSW, [place], [place] 07/31/2020 2:21 PM

## 2020-08-01 MED ORDER — AMLODIPINE BESYLATE 10 MG PO TABS: 10.0000 mg | ORAL_TABLET | Freq: Every day | ORAL | 1 refills | Status: AC

## 2020-08-01 MED ORDER — ESCITALOPRAM OXALATE 5 MG PO TABS: 5.0000 mg | ORAL_TABLET | Freq: Every day | ORAL | 1 refills | Status: AC

## 2020-08-01 MED ORDER — LISINOPRIL 10 MG PO TABS: 10.0000 mg | ORAL_TABLET | Freq: Every day | ORAL | 1 refills | Status: AC

## 2020-08-01 MED ORDER — HYDROCHLOROTHIAZIDE 50 MG PO TABS: 50.0000 mg | ORAL_TABLET | Freq: Every day | ORAL | 1 refills | Status: AC

## 2020-08-01 NOTE — Progress Notes (Signed)
  [facility] Adult Case Management Discharge Plan :  Will you be returning to the same living situation after discharge:  No. At discharge, do you have transportation home?: Yes,  Family to assist with transportation Do you have the ability to pay for your medications: Yes,  [place] Medicaid  Release of information consent forms completed and in the chart;  Patient's signature needed at discharge.  Patient to Follow up at:  Follow-up Information     [facility] Go on 08/03/2020.   Why: Please present to [facility] on Wednesday 6/29 @ 0730. Contact information: [address] [place] [address] [phone]                 Next level of care provider has access to [facility] Link:no  Safety Planning and Suicide Prevention discussed: Yes,  SPE completed with patient and grandmother Denice.     Has patient been referred to the Quitline?: Patient refused referral  Patient has been referred for addiction treatment: Pt. refused referral  Corky Crafts, LCSWA 08/01/2020, 9:07 AM

## 2020-08-01 NOTE — Progress Notes (Signed)
Pt denies SI, HI and AVH. Pt was educated on dc plan and verbalizes understanding. Pt was given AVS, transition packet and belongings. Torrie Mayers RN

## 2020-08-01 NOTE — Progress Notes (Signed)
Recreation Therapy Notes  Date: 08/01/2020  Time: 10:00 am   Location: Craft room      Behavioral response: N/A   Intervention Topic: Relaxation   Discussion/Intervention: Patient did not attend group.   Clinical Observations/Feedback:  Patient did not attend group.   [name] [name]         [name] 08/01/2020 12:11 PM

## 2020-08-01 NOTE — Plan of Care (Signed)
  Problem: Education: Goal: Utilization of techniques to improve thought processes will improve Outcome: Adequate for Discharge Goal: Knowledge of the prescribed therapeutic regimen will improve Outcome: Adequate for Discharge   Problem: Activity: Goal: Interest or engagement in leisure activities will improve Outcome: Adequate for Discharge Goal: Imbalance in normal sleep/wake cycle will improve Outcome: Adequate for Discharge   Problem: Coping: Goal: Coping ability will improve Outcome: Adequate for Discharge Goal: Will verbalize feelings Outcome: Adequate for Discharge   Problem: Health Behavior/Discharge Planning: Goal: Ability to make decisions will improve Outcome: Adequate for Discharge Goal: Compliance with therapeutic regimen will improve Outcome: Adequate for Discharge   Problem: Role Relationship: Goal: Will demonstrate positive changes in social behaviors and relationships Outcome: Adequate for Discharge   Problem: Safety: Goal: Ability to disclose and discuss suicidal ideas will improve Outcome: Adequate for Discharge Goal: Ability to identify and utilize support systems that promote safety will improve Outcome: Adequate for Discharge   Problem: Self-Concept: Goal: Will verbalize positive feelings about self Outcome: Adequate for Discharge Goal: Level of anxiety will decrease Outcome: Adequate for Discharge   Problem: Education: Goal: Knowledge of [place] [facility] Education information/materials will improve Outcome: Adequate for Discharge Goal: Emotional status will improve Outcome: Adequate for Discharge Goal: Mental status will improve Outcome: Adequate for Discharge Goal: Verbalization of understanding the information provided will improve Outcome: Adequate for Discharge   Problem: Activity: Goal: Interest or engagement in activities will improve Outcome: Adequate for Discharge Goal: Sleeping patterns will improve Outcome: Adequate for  Discharge   Problem: Health Behavior/Discharge Planning: Goal: Identification of resources available to assist in meeting health care needs will improve Outcome: Adequate for Discharge Goal: Compliance with treatment plan for underlying cause of condition will improve Outcome: Adequate for Discharge   Problem: Physical Regulation: Goal: Ability to maintain clinical measurements within normal limits will improve Outcome: Adequate for Discharge   Problem: Safety: Goal: Periods of time without injury will increase Outcome: Adequate for Discharge

## 2020-08-01 NOTE — Progress Notes (Signed)
Recreation Therapy Notes  INPATIENT RECREATION TR PLAN  Patient Details Name: [name] MRN: [id number] DOB: Nov 21, 1990 Today's Date: 08/01/2020  Rec Therapy Plan Is patient appropriate for Therapeutic Recreation?: Yes Treatment times per week: at least 3 Estimated Length of Stay: 5-7 days TR Treatment/Interventions: Group participation (Comment)  Discharge Criteria Pt will be discharged from therapy if:: Discharged Treatment plan/goals/alternatives discussed and agreed upon by:: Patient/family  Discharge Summary Short term goals set: Patient will identify 3 positive coping skills strategies to use for SI post d/c within 5 recreation therapy group sessions Short term goals met: Adequate for discharge Progress toward goals comments: Groups attended Which groups?: Leisure education Reason goals not met: N/A Therapeutic equipment acquired: N/A Reason patient discharged from therapy: Discharge from hospital Pt/family agrees with progress & goals achieved: Yes Date patient discharged from therapy: 08/01/20   Mykael Trott 08/01/2020, 12:13 PM

## 2020-08-01 NOTE — [facility] Suicide Risk Assessment (Signed)
[facility] Discharge Suicide Risk Assessment   Principal Problem: MDD (major depressive disorder), recurrent episode, severe (HCC) Discharge Diagnoses: Principal Problem:   MDD (major depressive disorder), recurrent episode, severe (HCC) Active Problems:   Hypertension   GAD (generalized anxiety disorder)   Total Time spent with patient: 35 minutes- 25 minutes face-to-face contact with patient, 10 minutes documentation, coordination of care, scripts   Musculoskeletal: Strength & Muscle Tone: within normal limits Gait & Station: normal Patient leans: N/A  Psychiatric Specialty Exam  Presentation  General Appearance: Appropriate for Environment; Casual  Eye Contact:Good  Speech:Normal Rate; Clear and Coherent  Speech Volume:Normal  Handedness:Right   Mood and Affect  Mood:Euthymic  Duration of Depression Symptoms: Greater than two weeks  Affect:Congruent   Thought Process  Thought Processes:Coherent; Goal Directed  Descriptions of Associations:Intact  Orientation:Full (Time, Place and Person)  Thought Content:Logical  History of Schizophrenia/Schizoaffective disorder:No  Duration of Psychotic Symptoms:No data recorded Hallucinations:Hallucinations: None  Ideas of Reference:None  Suicidal Thoughts:Suicidal Thoughts: No  Homicidal Thoughts:Homicidal Thoughts: No   Sensorium  Memory:Immediate Good; Recent Good; Remote Good  Judgment:Intact  Insight:Present   Executive Functions  Concentration:Good  Attention Span:Good  Recall:Good  Fund of Knowledge:Good  Language:Good   Psychomotor Activity  Psychomotor Activity:Psychomotor Activity: Normal   Assets  Assets:Communication Skills; Desire for Improvement; Financial Resources/Insurance; Housing; Intimacy; Resilience; Social Support; Talents/Skills; Transportation; Vocational/Educational   Sleep  Sleep:Sleep: Fair Number of Hours of Sleep: 7   Physical Exam: Physical Exam ROS Blood  pressure (!) 127/108, pulse (!) 106, temperature 98.5 F (36.9 C), temperature source Oral, resp. rate 18, height 5\' 3"  (1.6 m), weight 106.5 kg, last menstrual period 05/13/2016, SpO2 100 %. Body mass index is 41.59 kg/m.  Mental Status Per Nursing Assessment::   On Admission:  Self-harm behaviors  Demographic Factors:  NA  Loss Factors: Decline in physical health  Historical Factors: Prior suicide attempts  Risk Reduction Factors:   Responsible for children under 74 years of age, Sense of responsibility to family, Religious beliefs about death, Employed, Living with another person, especially a relative, Positive social support, Positive therapeutic relationship, and Positive coping skills or problem solving skills  Continued Clinical Symptoms:  Depression:   Recent sense of peace/wellbeing Medical Diagnoses and Treatments/Surgeries  Cognitive Features That Contribute To Risk:  None    Suicide Risk:  Minimal: No identifiable suicidal ideation.  Patients presenting with no risk factors but with morbid ruminations; may be classified as minimal risk based on the severity of the depressive symptoms   Follow-up Information     [employer] Go on 08/03/2020.   Why: Please present to [facility] on Wednesday 6/29 @ 0730. Contact information: [address] [place] [phone]                 Plan Of Care/Follow-up recommendations:  Activity:  as tolerated Diet:  low sodium heart healthy diet  [phone], MD 08/01/2020, 9:26 AM

## 2020-08-01 NOTE — Progress Notes (Signed)
Patient has been pleasant and cooperative. She says she knows she made a mistake by attempting suicide. Denies SI and says after being here she will never, ever attempt or think of suicide as a solution again. She finds some of the lower functioning psychotic patients to be unnerving and she does not want to ever be in a place like this again

## 2020-08-01 NOTE — Plan of Care (Signed)
  Problem: Coping Skills Goal: STG - Patient will identify 3 positive coping skills strategies to use for SI post d/c within 5 recreation therapy group sessions Description: STG - Patient will identify 3 positive coping skills strategies to use for SI post d/c within 5 recreation therapy group sessions 08/01/2020 1212 by Alveria Apley, LRT Outcome: Adequate for Discharge 08/01/2020 1211 by Alveria Apley, LRT Outcome: Adequate for Discharge

## 2020-08-01 NOTE — Plan of Care (Signed)
Pt denies depression, anxiety, SI, HI and AVH. Pt was educated on care plan and verbalizes understanding. Sara Mayers RN Problem: Education: Goal: Utilization of techniques to improve thought processes will improve Outcome: Adequate for Discharge Goal: Knowledge of the prescribed therapeutic regimen will improve Outcome: Adequate for Discharge   Problem: Activity: Goal: Interest or engagement in leisure activities will improve Outcome: Adequate for Discharge Goal: Imbalance in normal sleep/wake cycle will improve Outcome: Adequate for Discharge   Problem: Coping: Goal: Coping ability will improve Outcome: Adequate for Discharge Goal: Will verbalize feelings Outcome: Adequate for Discharge   Problem: Health Behavior/Discharge Planning: Goal: Ability to make decisions will improve Outcome: Adequate for Discharge Goal: Compliance with therapeutic regimen will improve Outcome: Adequate for Discharge   Problem: Role Relationship: Goal: Will demonstrate positive changes in social behaviors and relationships Outcome: Adequate for Discharge   Problem: Safety: Goal: Ability to disclose and discuss suicidal ideas will improve Outcome: Adequate for Discharge Goal: Ability to identify and utilize support systems that promote safety will improve Outcome: Adequate for Discharge   Problem: Self-Concept: Goal: Will verbalize positive feelings about self Outcome: Adequate for Discharge Goal: Level of anxiety will decrease Outcome: Adequate for Discharge   Problem: Education: Goal: Knowledge of [place] [facility] Education information/materials will improve Outcome: Adequate for Discharge Goal: Emotional status will improve Outcome: Adequate for Discharge Goal: Mental status will improve Outcome: Adequate for Discharge Goal: Verbalization of understanding the information provided will improve Outcome: Adequate for Discharge   Problem: Activity: Goal: Interest or engagement in  activities will improve Outcome: Adequate for Discharge Goal: Sleeping patterns will improve Outcome: Adequate for Discharge   Problem: Health Behavior/Discharge Planning: Goal: Identification of resources available to assist in meeting health care needs will improve Outcome: Adequate for Discharge Goal: Compliance with treatment plan for underlying cause of condition will improve Outcome: Adequate for Discharge   Problem: Physical Regulation: Goal: Ability to maintain clinical measurements within normal limits will improve Outcome: Adequate for Discharge   Problem: Safety: Goal: Periods of time without injury will increase Outcome: Adequate for Discharge
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
## 2020-08-03 DIAGNOSIS — F331 Major depressive disorder, recurrent, moderate: Secondary | ICD-10-CM | POA: Diagnosis not present

## 2020-08-16 DIAGNOSIS — S82871M Displaced pilon fracture of right tibia, subsequent encounter for open fracture type I or II with nonunion: Secondary | ICD-10-CM | POA: Diagnosis not present

## 2020-08-16 DIAGNOSIS — S82871A Displaced pilon fracture of right tibia, initial encounter for closed fracture: Secondary | ICD-10-CM | POA: Diagnosis not present

## 2020-10-21 ENCOUNTER — Other Ambulatory Visit: Payer: Self-pay

## 2020-10-21 ENCOUNTER — Ambulatory Visit: Payer: Medicaid Other | Admitting: Physician Assistant

## 2020-10-21 DIAGNOSIS — Z1388 Encounter for screening for disorder due to exposure to contaminants: Secondary | ICD-10-CM | POA: Diagnosis not present

## 2020-10-21 DIAGNOSIS — Z113 Encounter for screening for infections with a predominantly sexual mode of transmission: Secondary | ICD-10-CM

## 2020-10-21 DIAGNOSIS — Z0389 Encounter for observation for other suspected diseases and conditions ruled out: Secondary | ICD-10-CM | POA: Diagnosis not present

## 2020-10-21 DIAGNOSIS — Z3009 Encounter for other general counseling and advice on contraception: Secondary | ICD-10-CM | POA: Diagnosis not present

## 2020-10-21 LAB — WET PREP FOR TRICH, YEAST, CLUE
Trichomonas Exam: NEGATIVE
Yeast Exam: NEGATIVE

## 2020-10-21 NOTE — Progress Notes (Signed)
Patient here for STD testing, no symptoms. Wet mount reviewed, no tx per standing orders. Condoms declined.

## 2020-10-22 ENCOUNTER — Encounter: Payer: Self-pay | Admitting: Physician Assistant

## 2020-10-22 NOTE — Progress Notes (Signed)
  [facility] Department STI clinic/screening visit  Subjective:  Sara Morrison is a 30 y.o. female being seen today for an STI screening visit. The patient reports they do not have symptoms.  Patient reports that they do not desire a pregnancy in the next year.   They reported they are not interested in discussing contraception today.  Patient's last menstrual period was 05/13/2016.   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  States that she has a history of HTN but is not currently taking medicines.   Patient no longer has a period since she had a hysterectomy in 2020.  States that her last HIV test was in March of this year and last pap was prior to her hysterectomy in 2020.  See flowsheet for further details and programmatic requirements.    The following portions of the patient's history were reviewed and updated as appropriate: allergies, current medications, past medical history, past social history, past surgical history and problem list.  Objective:  There were no vitals filed for this visit.  Physical Exam Constitutional:      General: She is not in acute distress.    Appearance: Normal appearance.  HENT:     Head: Normocephalic and atraumatic.  Eyes:     Conjunctiva/sclera: Conjunctivae normal.  Pulmonary:     Effort: Pulmonary effort is normal.  Skin:    General: Skin is warm and dry.  Neurological:     Mental Status: She is alert and oriented to person,  place, and time.  Psychiatric:        Mood and Affect: Mood normal.        Behavior: Behavior normal.        Thought Content: Thought content normal.        Judgment: Judgment normal.     Assessment and Plan:  Sara Morrison is a 30 y.o. female presenting to the [facility] Department for STI screening  1. Screening for STD (sexually transmitted disease) Patient into clinic without symptoms. Patient declines pelvic exam by provider and desires to self-collect vaginal samples.  Counseled patient how to collect for accurate results. Reviewed wet mount results. Rec condoms with all sex. Await test results.  Counseled that RN will call if needs to RTC for treatment once results are back.  - WET PREP FOR TRICH, YEAST, CLUE - Chlamydia/Gonorrhea [place] [facility] - HIV [place] [facility] - Syphilis Serology, [place] [facility]     No follow-ups on file.  No future appointments.  Matt Holmes, PA

## 2020-11-15 DIAGNOSIS — S82871M Displaced pilon fracture of right tibia, subsequent encounter for open fracture type I or II with nonunion: Secondary | ICD-10-CM | POA: Diagnosis not present

## 2020-11-15 DIAGNOSIS — S82831D Other fracture of upper and lower end of right fibula, subsequent encounter for closed fracture with routine healing: Secondary | ICD-10-CM | POA: Diagnosis not present

## 2020-11-15 DIAGNOSIS — M7989 Other specified soft tissue disorders: Secondary | ICD-10-CM | POA: Diagnosis not present

## 2020-11-22 DIAGNOSIS — S82871A Displaced pilon fracture of right tibia, initial encounter for closed fracture: Secondary | ICD-10-CM | POA: Diagnosis not present

## 2020-11-22 DIAGNOSIS — S82871M Displaced pilon fracture of right tibia, subsequent encounter for open fracture type I or II with nonunion: Secondary | ICD-10-CM | POA: Diagnosis not present

## 2020-11-22 DIAGNOSIS — S82831D Other fracture of upper and lower end of right fibula, subsequent encounter for closed fracture with routine healing: Secondary | ICD-10-CM | POA: Diagnosis not present

## 2020-11-22 DIAGNOSIS — M19071 Primary osteoarthritis, right ankle and foot: Secondary | ICD-10-CM | POA: Diagnosis not present

## 2020-11-22 DIAGNOSIS — M8580 Other specified disorders of bone density and structure, unspecified site: Secondary | ICD-10-CM | POA: Diagnosis not present

## 2021-01-03 DIAGNOSIS — S82871M Displaced pilon fracture of right tibia, subsequent encounter for open fracture type I or II with nonunion: Secondary | ICD-10-CM | POA: Diagnosis not present

## 2021-01-03 DIAGNOSIS — S82871E Displaced pilon fracture of right tibia, subsequent encounter for open fracture type I or II with routine healing: Secondary | ICD-10-CM | POA: Diagnosis not present

## 2021-01-03 DIAGNOSIS — T8189XD Other complications of procedures, not elsewhere classified, subsequent encounter: Secondary | ICD-10-CM | POA: Diagnosis not present

## 2021-02-07 DIAGNOSIS — N760 Acute vaginitis: Secondary | ICD-10-CM | POA: Diagnosis not present

## 2021-02-16 DIAGNOSIS — I10 Essential (primary) hypertension: Secondary | ICD-10-CM | POA: Diagnosis not present

## 2021-02-16 DIAGNOSIS — M85871 Other specified disorders of bone density and structure, right ankle and foot: Secondary | ICD-10-CM | POA: Diagnosis not present

## 2021-05-30 DIAGNOSIS — S82871M Displaced pilon fracture of right tibia, subsequent encounter for open fracture type I or II with nonunion: Secondary | ICD-10-CM | POA: Diagnosis not present

## 2021-05-30 DIAGNOSIS — Z9889 Other specified postprocedural states: Secondary | ICD-10-CM | POA: Diagnosis not present

## 2021-06-21 DIAGNOSIS — Z01818 Encounter for other preprocedural examination: Secondary | ICD-10-CM | POA: Diagnosis not present

## 2021-06-21 DIAGNOSIS — E669 Obesity, unspecified: Secondary | ICD-10-CM | POA: Diagnosis not present

## 2021-06-21 DIAGNOSIS — Z6841 Body Mass Index (BMI) 40.0 and over, adult: Secondary | ICD-10-CM | POA: Diagnosis not present

## 2021-06-21 DIAGNOSIS — S82871M Displaced pilon fracture of right tibia, subsequent encounter for open fracture type I or II with nonunion: Secondary | ICD-10-CM | POA: Diagnosis not present

## 2021-06-21 DIAGNOSIS — I10 Essential (primary) hypertension: Secondary | ICD-10-CM | POA: Diagnosis not present

## 2021-06-28 DIAGNOSIS — S82871M Displaced pilon fracture of right tibia, subsequent encounter for open fracture type I or II with nonunion: Secondary | ICD-10-CM | POA: Diagnosis not present

## 2021-06-28 DIAGNOSIS — M6701 Short Achilles tendon (acquired), right ankle: Secondary | ICD-10-CM | POA: Diagnosis not present

## 2021-06-28 DIAGNOSIS — S82871K Displaced pilon fracture of right tibia, subsequent encounter for closed fracture with nonunion: Secondary | ICD-10-CM | POA: Diagnosis not present

## 2021-06-28 DIAGNOSIS — T84116D Breakdown (mechanical) of internal fixation device of bone of right lower leg, subsequent encounter: Secondary | ICD-10-CM | POA: Diagnosis not present

## 2021-06-28 DIAGNOSIS — I10 Essential (primary) hypertension: Secondary | ICD-10-CM | POA: Diagnosis not present

## 2021-06-28 DIAGNOSIS — G8918 Other acute postprocedural pain: Secondary | ICD-10-CM | POA: Diagnosis not present

## 2021-06-29 DIAGNOSIS — S82871M Displaced pilon fracture of right tibia, subsequent encounter for open fracture type I or II with nonunion: Secondary | ICD-10-CM | POA: Diagnosis not present

## 2021-07-18 DIAGNOSIS — S82871A Displaced pilon fracture of right tibia, initial encounter for closed fracture: Secondary | ICD-10-CM | POA: Diagnosis not present

## 2021-07-18 DIAGNOSIS — M25571 Pain in right ankle and joints of right foot: Secondary | ICD-10-CM | POA: Diagnosis not present

## 2021-07-18 DIAGNOSIS — S82871M Displaced pilon fracture of right tibia, subsequent encounter for open fracture type I or II with nonunion: Secondary | ICD-10-CM | POA: Diagnosis not present

## 2021-07-21 DIAGNOSIS — L02214 Cutaneous abscess of groin: Secondary | ICD-10-CM | POA: Diagnosis not present

## 2021-07-21 DIAGNOSIS — N898 Other specified noninflammatory disorders of vagina: Secondary | ICD-10-CM | POA: Diagnosis not present

## 2021-08-29 DIAGNOSIS — S82871M Displaced pilon fracture of right tibia, subsequent encounter for open fracture type I or II with nonunion: Secondary | ICD-10-CM | POA: Diagnosis not present

## 2021-08-29 DIAGNOSIS — Z87891 Personal history of nicotine dependence: Secondary | ICD-10-CM | POA: Diagnosis not present

## 2021-10-06 DIAGNOSIS — N760 Acute vaginitis: Secondary | ICD-10-CM | POA: Diagnosis not present

## 2021-10-06 DIAGNOSIS — N898 Other specified noninflammatory disorders of vagina: Secondary | ICD-10-CM | POA: Diagnosis not present

## 2021-10-23 DIAGNOSIS — S82871M Displaced pilon fracture of right tibia, subsequent encounter for open fracture type I or II with nonunion: Secondary | ICD-10-CM | POA: Diagnosis not present

## 2021-10-23 DIAGNOSIS — S82831M Other fracture of upper and lower end of right fibula, subsequent encounter for open fracture type I or II with nonunion: Secondary | ICD-10-CM | POA: Diagnosis not present

## 2021-10-31 DIAGNOSIS — F332 Major depressive disorder, recurrent severe without psychotic features: Secondary | ICD-10-CM | POA: Diagnosis not present

## 2021-10-31 DIAGNOSIS — Z79899 Other long term (current) drug therapy: Secondary | ICD-10-CM | POA: Diagnosis not present

## 2021-10-31 DIAGNOSIS — Z114 Encounter for screening for human immunodeficiency virus [HIV]: Secondary | ICD-10-CM | POA: Diagnosis not present

## 2021-10-31 DIAGNOSIS — Z23 Encounter for immunization: Secondary | ICD-10-CM | POA: Diagnosis not present

## 2021-10-31 DIAGNOSIS — Z0000 Encounter for general adult medical examination without abnormal findings: Secondary | ICD-10-CM | POA: Diagnosis not present

## 2021-10-31 DIAGNOSIS — F331 Major depressive disorder, recurrent, moderate: Secondary | ICD-10-CM | POA: Diagnosis not present

## 2021-10-31 DIAGNOSIS — F411 Generalized anxiety disorder: Secondary | ICD-10-CM | POA: Diagnosis not present

## 2021-10-31 DIAGNOSIS — Z136 Encounter for screening for cardiovascular disorders: Secondary | ICD-10-CM | POA: Diagnosis not present

## 2021-10-31 DIAGNOSIS — Z6841 Body Mass Index (BMI) 40.0 and over, adult: Secondary | ICD-10-CM | POA: Diagnosis not present

## 2021-10-31 DIAGNOSIS — I10 Essential (primary) hypertension: Secondary | ICD-10-CM | POA: Diagnosis not present

## 2021-10-31 DIAGNOSIS — Z1159 Encounter for screening for other viral diseases: Secondary | ICD-10-CM | POA: Diagnosis not present

## 2021-11-14 DIAGNOSIS — R569 Unspecified convulsions: Secondary | ICD-10-CM | POA: Diagnosis not present

## 2021-11-14 DIAGNOSIS — E559 Vitamin D deficiency, unspecified: Secondary | ICD-10-CM | POA: Diagnosis not present

## 2021-11-14 DIAGNOSIS — R55 Syncope and collapse: Secondary | ICD-10-CM | POA: Diagnosis not present

## 2022-01-05 DIAGNOSIS — Z419 Encounter for procedure for purposes other than remedying health state, unspecified: Secondary | ICD-10-CM | POA: Diagnosis not present

## 2022-01-22 DIAGNOSIS — S82871M Displaced pilon fracture of right tibia, subsequent encounter for open fracture type I or II with nonunion: Secondary | ICD-10-CM | POA: Diagnosis not present

## 2022-01-22 DIAGNOSIS — Z87891 Personal history of nicotine dependence: Secondary | ICD-10-CM | POA: Diagnosis not present

## 2022-02-05 DIAGNOSIS — Z419 Encounter for procedure for purposes other than remedying health state, unspecified: Secondary | ICD-10-CM | POA: Diagnosis not present

## 2022-02-06 DIAGNOSIS — I10 Essential (primary) hypertension: Secondary | ICD-10-CM | POA: Diagnosis not present

## 2022-02-06 DIAGNOSIS — E559 Vitamin D deficiency, unspecified: Secondary | ICD-10-CM | POA: Diagnosis not present

## 2022-02-06 DIAGNOSIS — F332 Major depressive disorder, recurrent severe without psychotic features: Secondary | ICD-10-CM | POA: Diagnosis not present

## 2022-02-06 DIAGNOSIS — R569 Unspecified convulsions: Secondary | ICD-10-CM | POA: Diagnosis not present

## 2022-02-06 DIAGNOSIS — L0231 Cutaneous abscess of buttock: Secondary | ICD-10-CM | POA: Diagnosis not present

## 2022-02-06 DIAGNOSIS — N898 Other specified noninflammatory disorders of vagina: Secondary | ICD-10-CM | POA: Diagnosis not present

## 2022-02-06 DIAGNOSIS — Z113 Encounter for screening for infections with a predominantly sexual mode of transmission: Secondary | ICD-10-CM | POA: Diagnosis not present

## 2022-03-08 DIAGNOSIS — Z419 Encounter for procedure for purposes other than remedying health state, unspecified: Secondary | ICD-10-CM | POA: Diagnosis not present

## 2022-04-06 DIAGNOSIS — Z419 Encounter for procedure for purposes other than remedying health state, unspecified: Secondary | ICD-10-CM | POA: Diagnosis not present

## 2022-04-24 DIAGNOSIS — S82871M Displaced pilon fracture of right tibia, subsequent encounter for open fracture type I or II with nonunion: Secondary | ICD-10-CM | POA: Diagnosis not present

## 2022-04-24 DIAGNOSIS — M19071 Primary osteoarthritis, right ankle and foot: Secondary | ICD-10-CM | POA: Diagnosis not present

## 2022-04-24 DIAGNOSIS — S82871A Displaced pilon fracture of right tibia, initial encounter for closed fracture: Secondary | ICD-10-CM | POA: Diagnosis not present

## 2022-05-07 DIAGNOSIS — Z419 Encounter for procedure for purposes other than remedying health state, unspecified: Secondary | ICD-10-CM | POA: Diagnosis not present

## 2022-06-06 DIAGNOSIS — Z419 Encounter for procedure for purposes other than remedying health state, unspecified: Secondary | ICD-10-CM | POA: Diagnosis not present

## 2022-06-22 DIAGNOSIS — E559 Vitamin D deficiency, unspecified: Secondary | ICD-10-CM | POA: Diagnosis not present

## 2022-07-07 DIAGNOSIS — Z419 Encounter for procedure for purposes other than remedying health state, unspecified: Secondary | ICD-10-CM | POA: Diagnosis not present

## 2022-08-06 DIAGNOSIS — Z419 Encounter for procedure for purposes other than remedying health state, unspecified: Secondary | ICD-10-CM | POA: Diagnosis not present

## 2022-08-23 DIAGNOSIS — Z113 Encounter for screening for infections with a predominantly sexual mode of transmission: Secondary | ICD-10-CM | POA: Diagnosis not present

## 2022-08-23 DIAGNOSIS — N760 Acute vaginitis: Secondary | ICD-10-CM | POA: Diagnosis not present

## 2022-08-28 DIAGNOSIS — I10 Essential (primary) hypertension: Secondary | ICD-10-CM | POA: Diagnosis not present

## 2022-08-28 DIAGNOSIS — F419 Anxiety disorder, unspecified: Secondary | ICD-10-CM | POA: Diagnosis not present

## 2022-08-28 DIAGNOSIS — F332 Major depressive disorder, recurrent severe without psychotic features: Secondary | ICD-10-CM | POA: Diagnosis not present

## 2022-08-28 DIAGNOSIS — F411 Generalized anxiety disorder: Secondary | ICD-10-CM | POA: Diagnosis not present

## 2022-09-05 DIAGNOSIS — F411 Generalized anxiety disorder: Secondary | ICD-10-CM | POA: Diagnosis not present

## 2022-09-05 DIAGNOSIS — L02224 Furuncle of groin: Secondary | ICD-10-CM | POA: Diagnosis not present

## 2022-09-05 DIAGNOSIS — F332 Major depressive disorder, recurrent severe without psychotic features: Secondary | ICD-10-CM | POA: Diagnosis not present

## 2022-09-05 DIAGNOSIS — F419 Anxiety disorder, unspecified: Secondary | ICD-10-CM | POA: Diagnosis not present

## 2022-09-06 DIAGNOSIS — Z419 Encounter for procedure for purposes other than remedying health state, unspecified: Secondary | ICD-10-CM | POA: Diagnosis not present

## 2022-10-05 DIAGNOSIS — F419 Anxiety disorder, unspecified: Secondary | ICD-10-CM | POA: Diagnosis not present

## 2022-10-05 DIAGNOSIS — F332 Major depressive disorder, recurrent severe without psychotic features: Secondary | ICD-10-CM | POA: Diagnosis not present

## 2022-10-05 DIAGNOSIS — E559 Vitamin D deficiency, unspecified: Secondary | ICD-10-CM | POA: Diagnosis not present

## 2022-10-05 DIAGNOSIS — F5105 Insomnia due to other mental disorder: Secondary | ICD-10-CM | POA: Diagnosis not present

## 2022-10-05 DIAGNOSIS — F411 Generalized anxiety disorder: Secondary | ICD-10-CM | POA: Diagnosis not present

## 2022-10-07 DIAGNOSIS — Z419 Encounter for procedure for purposes other than remedying health state, unspecified: Secondary | ICD-10-CM | POA: Diagnosis not present

## 2022-11-06 DIAGNOSIS — Z419 Encounter for procedure for purposes other than remedying health state, unspecified: Secondary | ICD-10-CM | POA: Diagnosis not present

## 2022-12-07 DIAGNOSIS — Z419 Encounter for procedure for purposes other than remedying health state, unspecified: Secondary | ICD-10-CM | POA: Diagnosis not present

## 2023-01-02 DIAGNOSIS — F332 Major depressive disorder, recurrent severe without psychotic features: Secondary | ICD-10-CM | POA: Diagnosis not present

## 2023-01-02 DIAGNOSIS — I10 Essential (primary) hypertension: Secondary | ICD-10-CM | POA: Diagnosis not present

## 2023-01-02 DIAGNOSIS — F411 Generalized anxiety disorder: Secondary | ICD-10-CM | POA: Diagnosis not present

## 2023-01-02 DIAGNOSIS — Z136 Encounter for screening for cardiovascular disorders: Secondary | ICD-10-CM | POA: Diagnosis not present

## 2023-06-02 IMAGING — DX DG FOOT COMPLETE 3+V*R*
3 series · 3 of 3 positions shown · non-contrast
Comparison: 03/26/2013

CLINICAL DATA: Chronic right foot and ankle pain since fracture
November 2019

EXAM:
RIGHT FOOT COMPLETE - 3+ VIEW

[foot ap]
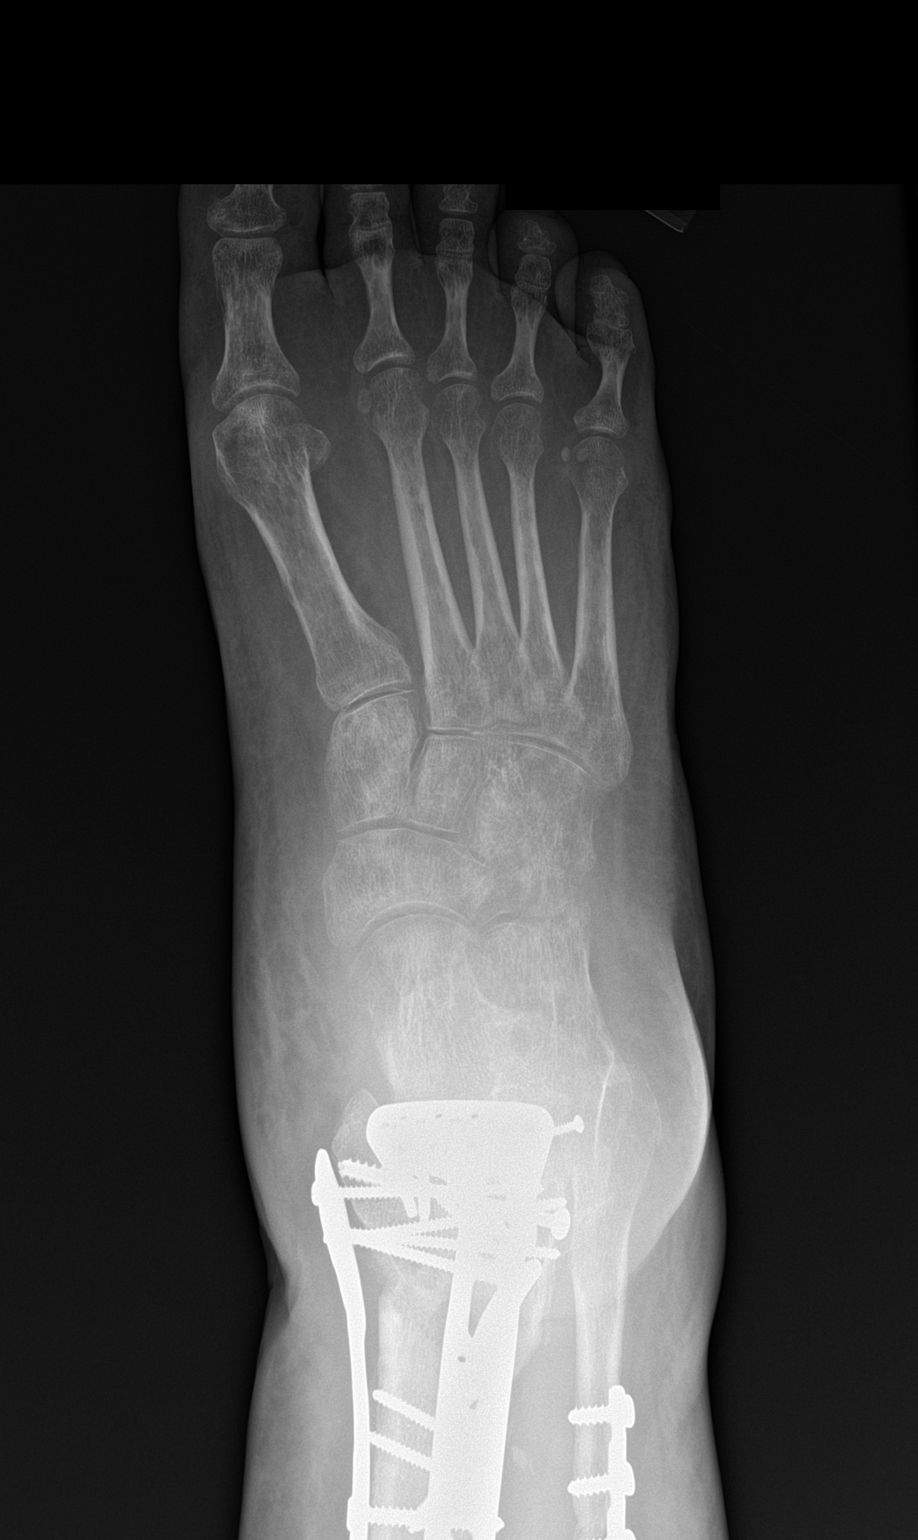

[foot obl]
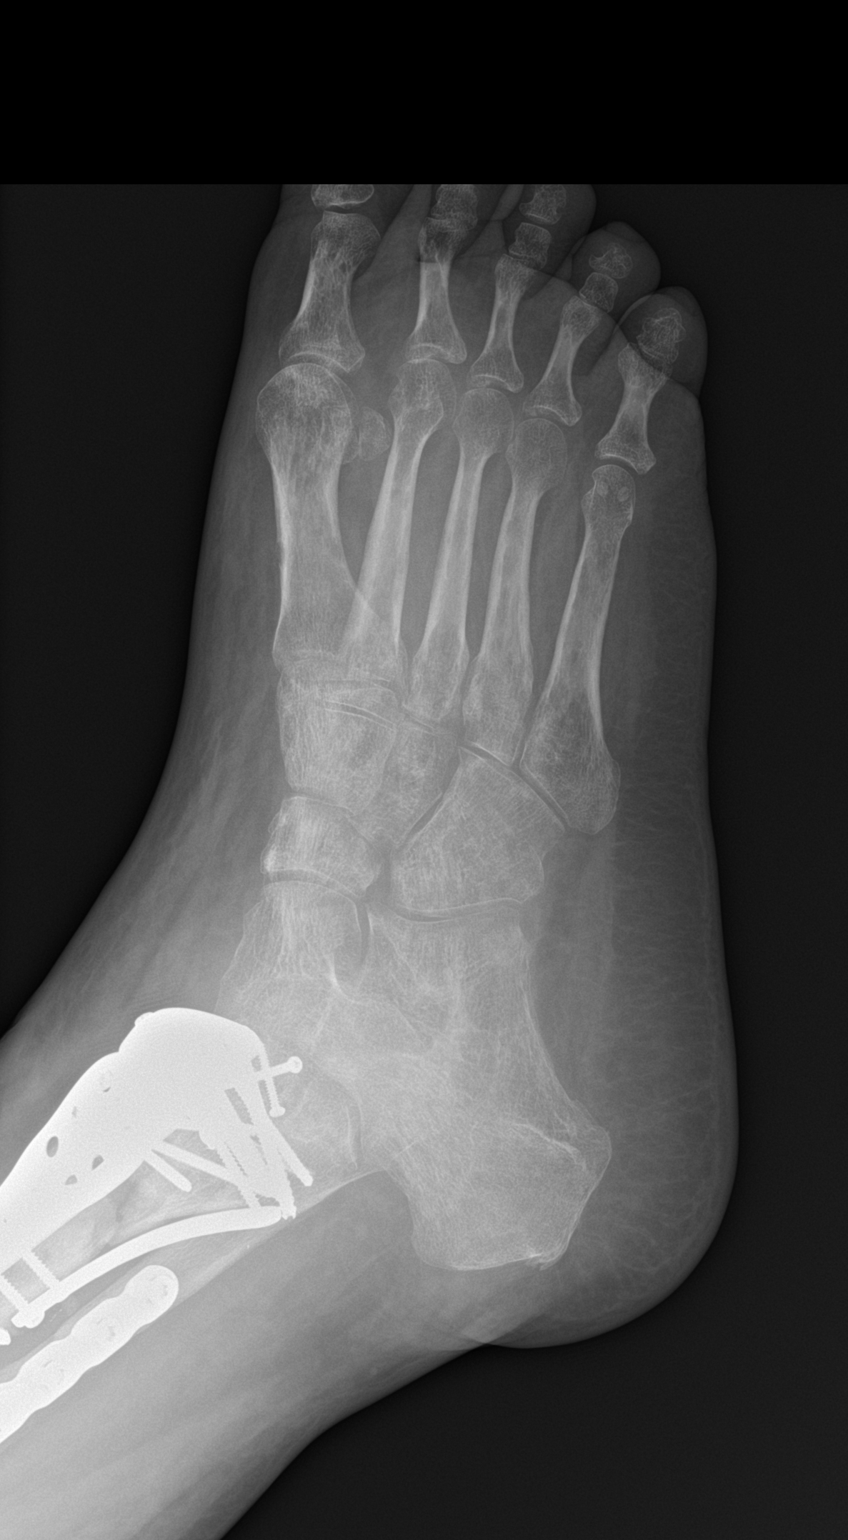

[foot lat]
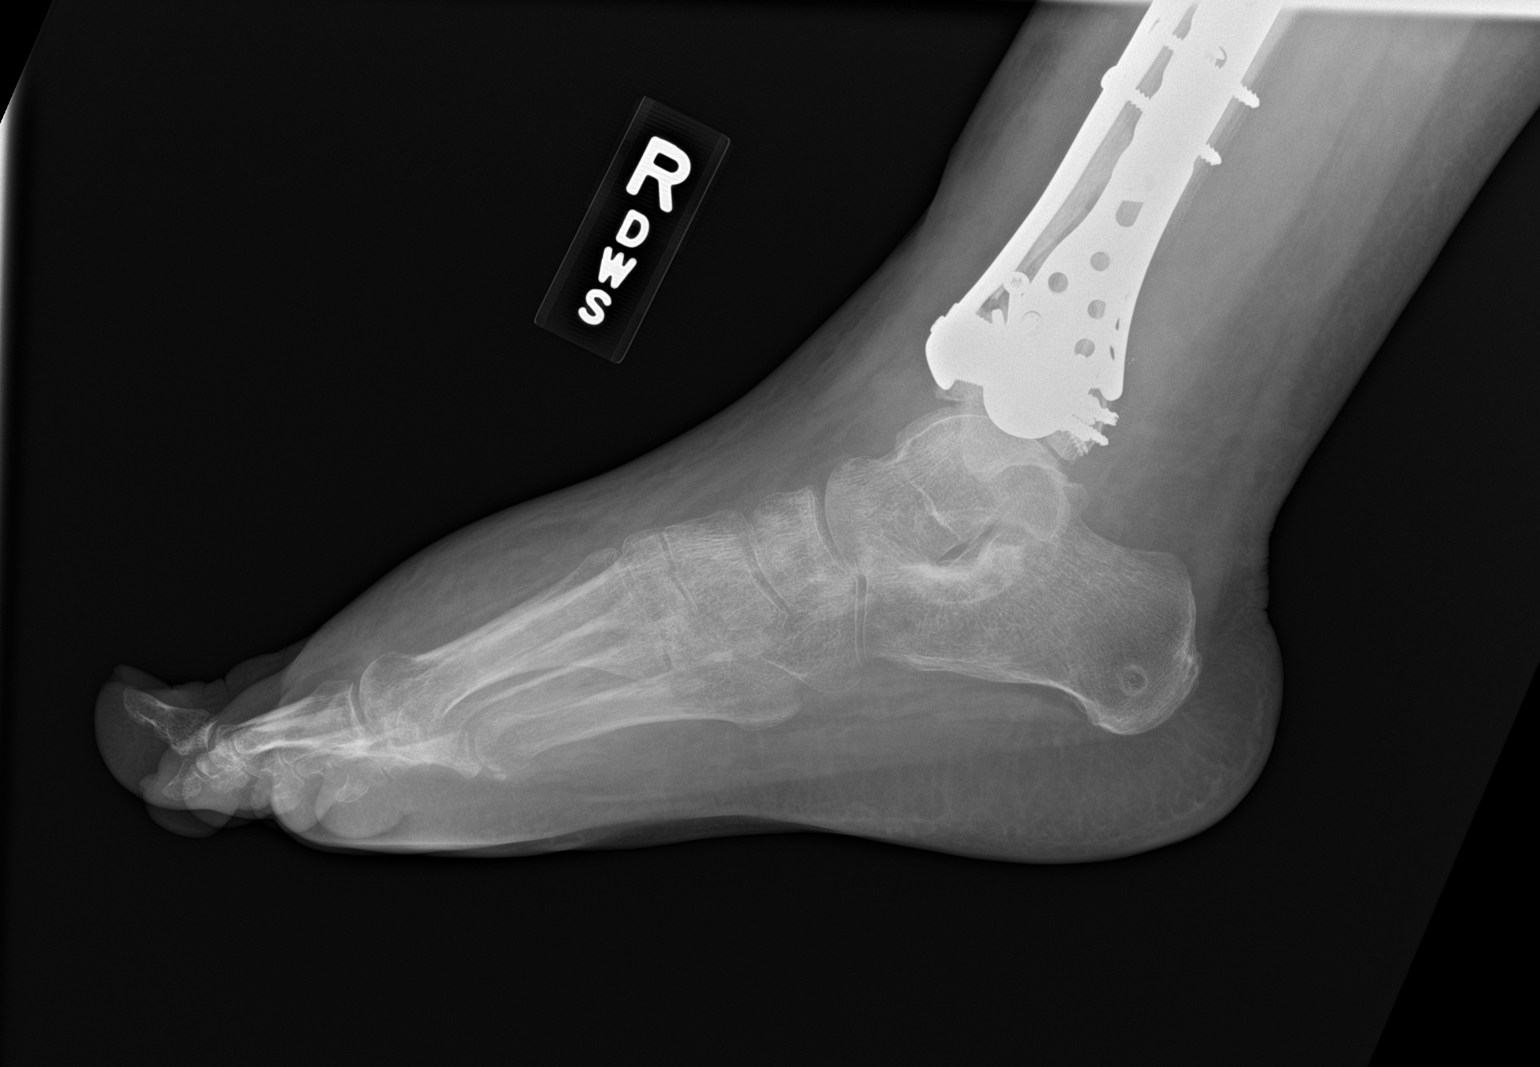

[3 of 3 positions shown; findings below may reference images not displayed]

FINDINGS: Generalized osteopenia which is pronounced. Negative for fracture or
subluxation. No detected erosion. Generalized soft tissue swelling.
Ankle findings reported separately.
IMPRESSION: 1. Nonspecific soft tissue swelling. No acute or focal osseous
finding.
2. Prominent disuse osteopenia.

## 2023-06-02 IMAGING — CT CT HEAD W/O CM
3 series · 15 of 47 positions shown, 18 images · non-contrast
Comparison: None.

CLINICAL DATA: Fall and facial trauma

EXAM:
CT HEAD WITHOUT CONTRAST
CT MAXILLOFACIAL WITHOUT CONTRAST
CT CERVICAL SPINE WITHOUT CONTRAST
TECHNIQUE: Multidetector CT imaging of the head, cervical spine, and
maxillofacial structures were performed using the standard protocol
without intravenous contrast. Multiplanar CT image reconstructions
of the cervical spine and maxillofacial structures were also
generated.

[Series 2: head wo · axial · 0.42mm/px · z∈[-77,+48]mm · 9 of 31 slices shown, 12 images]
[im 3/31  brain]
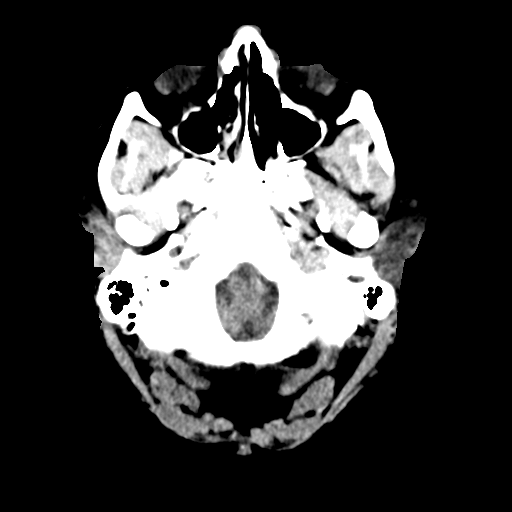
[im 3/31  bone]
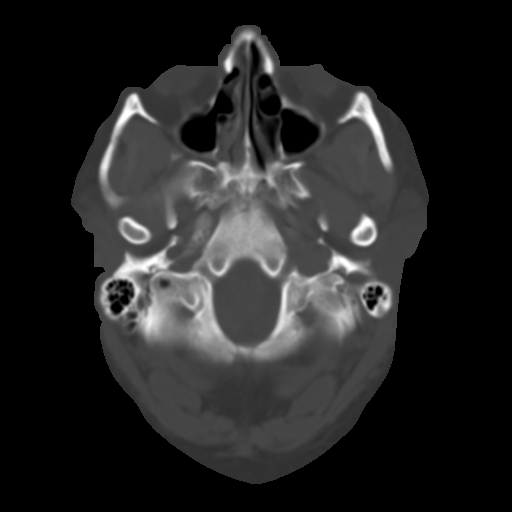
[im 6/31  brain]
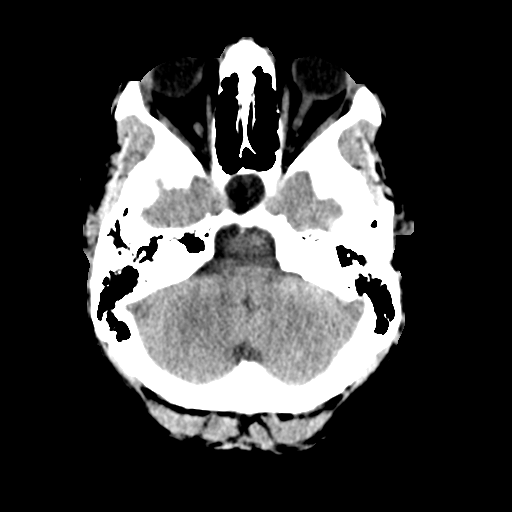
[im 9/31  brain]
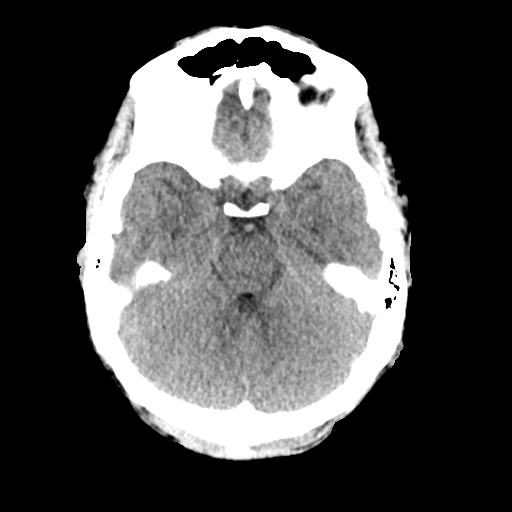
[im 12/31  brain]
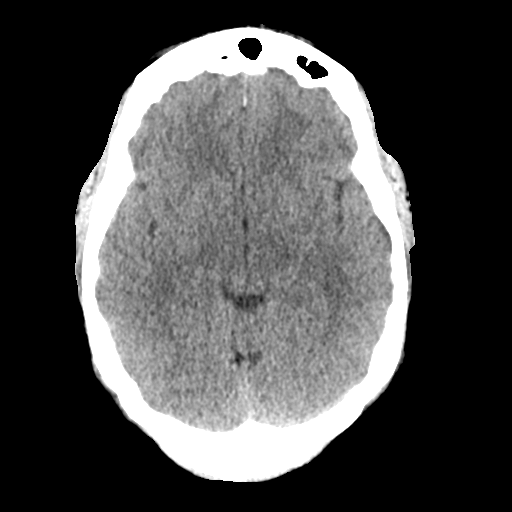
[im 16/31  brain]
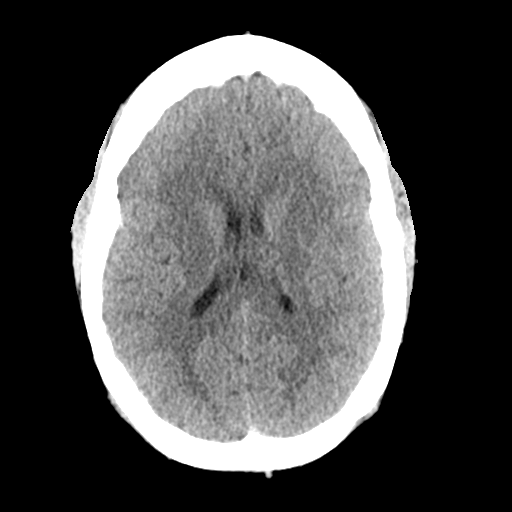
[im 16/31  bone]
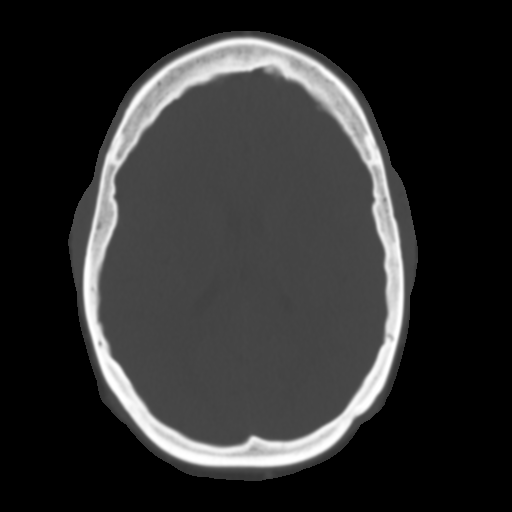
[im 19/31  brain]
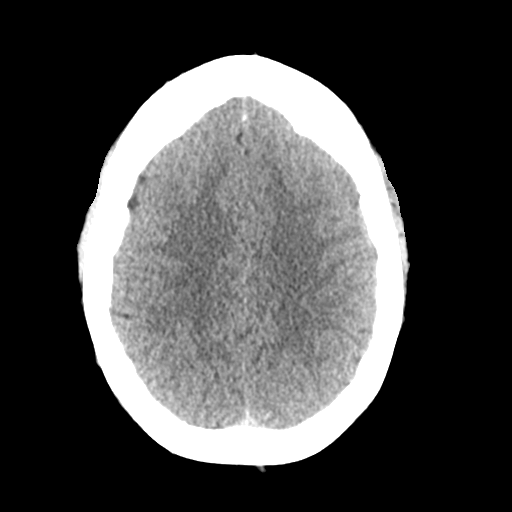
[im 22/31  brain]
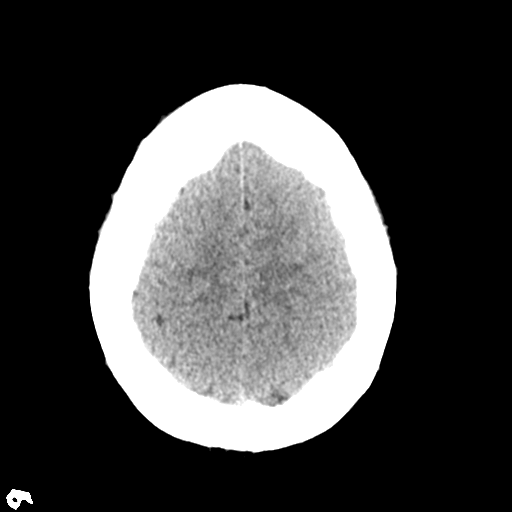
[im 25/31  brain]
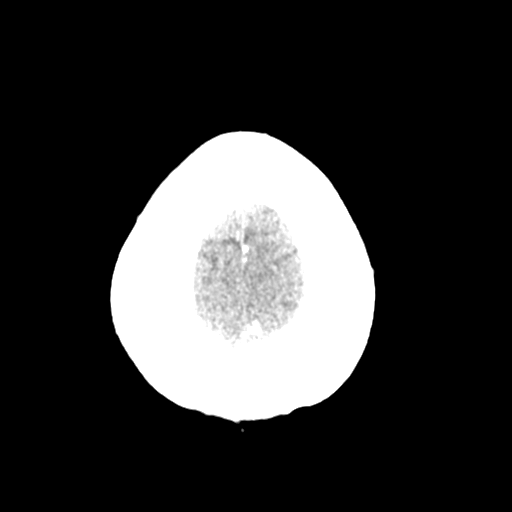
[im 28/31  brain]
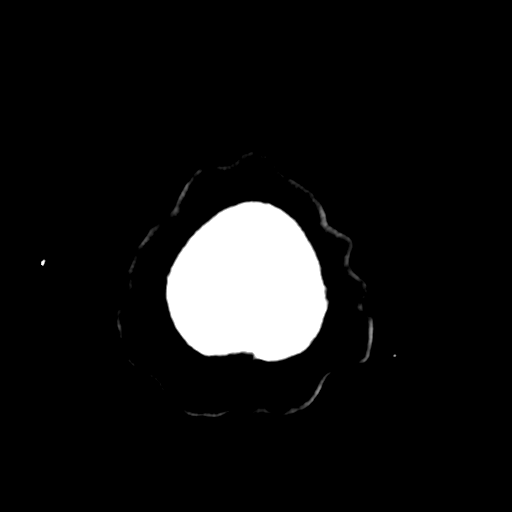
[im 28/31  bone]
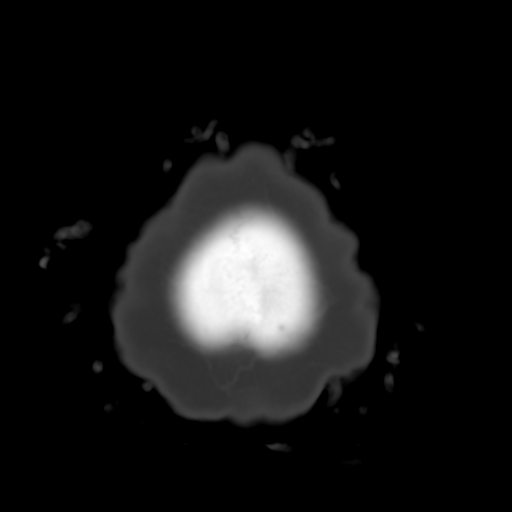

[Series 4: coronal soft tissue · coronal · 0.36mm/px · 3 of 70 slices shown]
[im 24/70  brain]
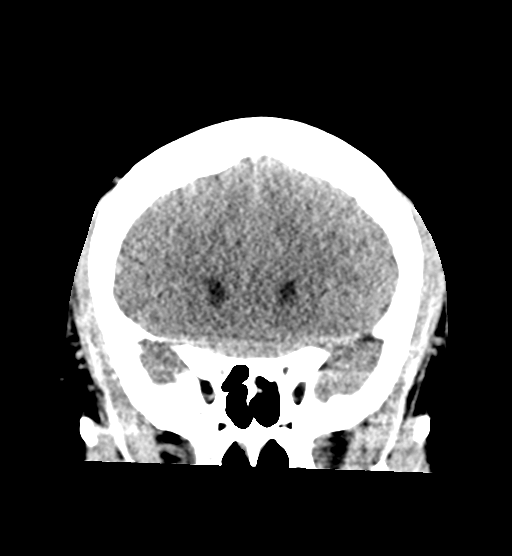
[im 31/70  brain]
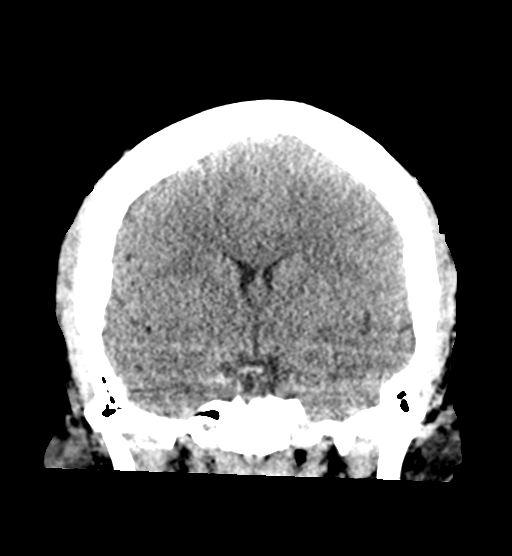
[im 39/70  brain]
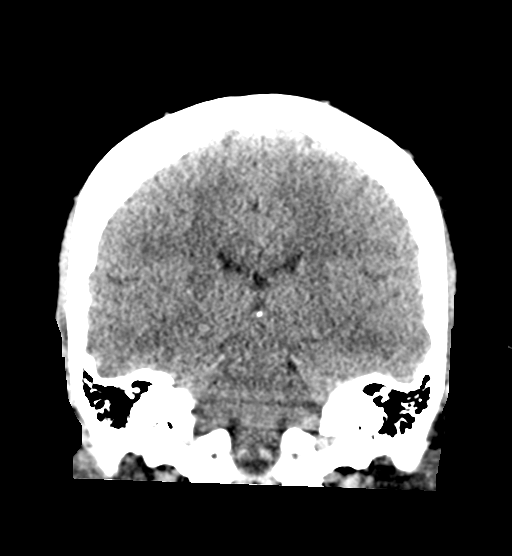

[Series 5: sagittal soft tissue · sagittal · 0.31mm/px · 3 of 60 slices shown]
[im 20/60  brain]
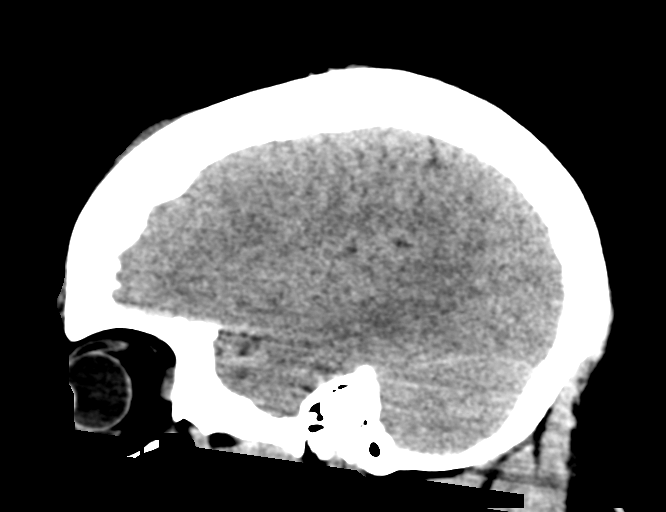
[im 30/60  brain]
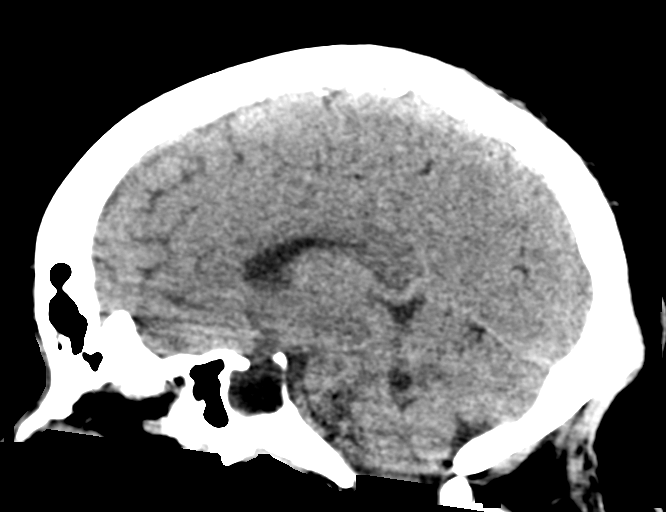
[im 40/60  brain]
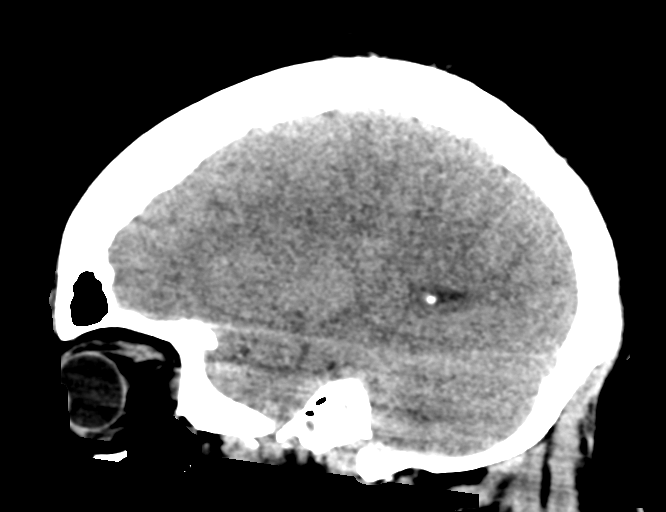

[15 of 47 positions shown; findings below may reference images not displayed]

FINDINGS: CT HEAD FINDINGS

Brain: No evidence of acute infarction, hemorrhage, hydrocephalus,
extra-axial collection or mass lesion/mass effect. Empty sella
appearance, incidental with this clinical history.

Vascular: Negative

Skull: Negative for fracture

CT MAXILLOFACIAL FINDINGS

Osseous: No fracture or mandibular dislocation. No destructive
process.

Orbits: No evidence of injury

Sinuses: No hemosinus

Soft tissues: Negative

CT CERVICAL SPINE FINDINGS

Alignment: Normal

Skull base and vertebrae: No acute fracture. No primary bone lesion
or focal pathologic process.

Soft tissues and spinal canal: No prevertebral fluid or swelling. No
visible canal hematoma.

Disc levels:  No degenerative changes

Upper chest: Negative
IMPRESSION: No evidence of intracranial injury. Negative for facial or cervical
spine fracture.

## 2023-06-06 ENCOUNTER — Ambulatory Visit

## 2024-01-17 DIAGNOSIS — Z419 Encounter for procedure for purposes other than remedying health state, unspecified: Secondary | ICD-10-CM | POA: Diagnosis not present

## 2024-02-18 ENCOUNTER — Emergency Department

## 2024-02-18 ENCOUNTER — Encounter: Payer: Self-pay | Admitting: Emergency Medicine

## 2024-02-18 ENCOUNTER — Emergency Department
Admission: EM | Admit: 2024-02-18 | Discharge: 2024-02-19 | Disposition: A | Attending: Emergency Medicine | Admitting: Emergency Medicine

## 2024-02-18 ENCOUNTER — Other Ambulatory Visit: Payer: Self-pay

## 2024-02-18 DIAGNOSIS — S6992XA Unspecified injury of left wrist, hand and finger(s), initial encounter: Secondary | ICD-10-CM | POA: Diagnosis present

## 2024-02-18 DIAGNOSIS — W540XXA Bitten by dog, initial encounter: Secondary | ICD-10-CM | POA: Diagnosis not present

## 2024-02-18 DIAGNOSIS — Z23 Encounter for immunization: Secondary | ICD-10-CM | POA: Diagnosis not present

## 2024-02-18 DIAGNOSIS — Z2914 Encounter for prophylactic rabies immune globin: Secondary | ICD-10-CM | POA: Insufficient documentation

## 2024-02-18 DIAGNOSIS — S61412A Laceration without foreign body of left hand, initial encounter: Secondary | ICD-10-CM | POA: Insufficient documentation

## 2024-02-18 DIAGNOSIS — Z203 Contact with and (suspected) exposure to rabies: Secondary | ICD-10-CM | POA: Diagnosis not present

## 2024-02-18 MED ORDER — AMOXICILLIN-POT CLAVULANATE 875-125 MG PO TABS: 1.0000 | ORAL_TABLET | Freq: Two times a day (BID) | ORAL | 0 refills | Status: AC

## 2024-02-18 MED ORDER — RABIES IMMUNE GLOBULIN 300 UNIT/2ML IJ SOLN
20.0000 [IU]/kg | Freq: Once | INTRAMUSCULAR | Status: AC
  Administered 2024-02-19: 2400 [IU] via INTRAMUSCULAR
  Filled 2024-02-18: qty 6

## 2024-02-18 MED ORDER — LIDOCAINE HCL (PF) 1 % IJ SOLN
10.0000 mL | Freq: Once | INTRAMUSCULAR | Status: AC
  Administered 2024-02-19: 10 mL
  Filled 2024-02-18: qty 10

## 2024-02-18 MED ORDER — AMOXICILLIN-POT CLAVULANATE 875-125 MG PO TABS
1.0000 | ORAL_TABLET | Freq: Once | ORAL | Status: AC
  Administered 2024-02-19: 1 via ORAL
  Filled 2024-02-18: qty 1

## 2024-02-18 MED ORDER — LIDOCAINE-EPINEPHRINE-TETRACAINE (LET) TOPICAL GEL
3.0000 mL | Freq: Once | TOPICAL | Status: AC
  Administered 2024-02-19: 3 mL via TOPICAL
  Filled 2024-02-18: qty 3

## 2024-02-18 MED ORDER — RABIES VIRUS VACCINE, HDC IM SUSR
1.0000 mL | Freq: Once | INTRAMUSCULAR | Status: AC
  Administered 2024-02-19: 1 mL via INTRAMUSCULAR
  Filled 2024-02-18: qty 1

## 2024-02-18 MED ORDER — TETANUS-DIPHTH-ACELL PERTUSSIS 5-2-15.5 LF-MCG/0.5 IM SUSP
0.5000 mL | Freq: Once | INTRAMUSCULAR | Status: AC
  Administered 2024-02-19: 0.5 mL via INTRAMUSCULAR
  Filled 2024-02-18: qty 0.5

## 2024-02-18 NOTE — ED Triage Notes (Signed)
 Pt presents to the ED via POV with complaints of dog bite to her L hand, R wrist about 30 mins ago. Pt unsure of the description of the dog or where the incident occurred. A&Ox4 at this time. Denies CP or SOB.

## 2024-02-18 NOTE — Discharge Instructions (Addendum)
 Take Augmentin  antibiotic for full course.   Get rabies vaccine  from [facility], your doctor, or our emergency department on days 0 (already got this one tonight), 3, 7, 14. On day 7-10 get the sutures removed.   Please keep your wound clean by washing at least daily with soap and water. Apply antibiotic ointment daily and a bandage.  If you see any signs of infection like spreading redness, pus coming from the wound, extreme pain, fevers, chills or any other worsening doctor right away or come back to the emergency department.  Take acetaminophen  650 mg and ibuprofen 400 mg every 6 hours for pain.  Take with food. Apply ice for pain and swelling.

## 2024-02-18 NOTE — ED Notes (Signed)
 [place] communications contacted regarding the dog bite incident and a report made. Advised by communications to have the patient contact them following discharge.

## 2024-02-18 NOTE — ED Provider Notes (Signed)
 "  [facility] Provider Note    Event Date/Time   First MD Initiated Contact with Patient 02/18/24 2340     (approximate)   History   Animal Bite   HPI  Sara Morrison is a 34 y.o. female   Past medical history of non-pertinent PMH here with dog bite to hands / wrists.  Was outside and stray bit her and ran away.  Police aware and cannot find dog.   L palm has a larger bite wound and R dorsum wrist has punctate one.   No other injury.    External Medical Documents Reviewed: previous outpatient notes      Physical Exam   Triage Vital Signs: ED Triage Vitals  Encounter Vitals Group     BP 02/18/24 2012 (!) 82/56     Girls Systolic BP Percentile --      Girls Diastolic BP Percentile --      Boys Systolic BP Percentile --      Boys Diastolic BP Percentile --      Pulse Rate 02/18/24 2012 (!) 58     Resp 02/18/24 2012 18     Temp 02/18/24 2012 98.8 F (37.1 C)     Temp src --      SpO2 02/18/24 2012 100 %     Weight 02/18/24 2010 250 lb (113.4 kg)     Height 02/18/24 2010 5' 3 (1.6 m)     Head Circumference --      Peak Flow --      Pain Score --      Pain Loc --      Pain Education --      Exclude from Growth Chart --     Most recent vital signs: Vitals:   02/18/24 2012 02/18/24 2018  BP: (!) 82/56 92/64  Pulse: (!) 58 60  Resp: 18   Temp: 98.8 F (37.1 C)   SpO2: 100%     General: Awake, no distress.  CV:  Good peripheral perfusion.  Resp:  Normal effort.  Abd:  No distention.  Other:  L palm has gaping 1.5cm defect with exposed fat. Punctate wound to dorsum wrist R. Scattered superficial abrasions on back of hands. NV intact, able to range. Brisk cap refill on both.    ED Results / Procedures / Treatments   Labs (all labs ordered are listed, but only abnormal results are displayed) Labs Reviewed - No data to display   RADIOLOGY I independently reviewed and interpreted L hand XR and see no FB or bony defect I  also reviewed radiologist's formal read.   PROCEDURES:  Critical Care performed: No  .Laceration Repair  Date/Time: 02/19/2024 1:54 AM  Performed by: Cyrena Mylar, MD Authorized by: Cyrena Mylar, MD   Consent:    Consent obtained:  Verbal   Consent given by:  Patient   Risks discussed:  Infection, pain, poor wound healing, poor cosmetic result and need for additional repair   Alternatives discussed:  Delayed treatment and no treatment Universal protocol:    Procedure explained and questions answered to patient or proxy's satisfaction: yes     Patient identity confirmed:  Verbally with patient Anesthesia:    Anesthesia method:  Topical application and local infiltration   Topical anesthetic:  LET   Local anesthetic:  Lidocaine  1% w/o epi Laceration details:    Location:  Hand   Hand location:  L palm   Length (cm):  1.5   Depth (mm):  4 Pre-procedure details:    Preparation:  Imaging obtained to evaluate for foreign bodies Exploration:    Imaging obtained: x-ray     Imaging outcome: foreign body not noted     Wound extent: no foreign body   Treatment:    Area cleansed with:  Soap and water   Amount of cleaning:  Extensive   Irrigation solution:  Sterile saline   Irrigation method:  Syringe   Debridement:  Minimal Skin repair:    Repair method:  Sutures   Suture size:  5-0   Suture material:  Nylon   Suture technique:  Simple interrupted   Number of sutures:  2 Approximation:    Approximation:  Loose Repair type:    Repair type:  Intermediate Post-procedure details:    Dressing:  Sterile dressing   Procedure completion:  Tolerated    MEDICATIONS ORDERED IN ED: Medications  Tdap (ADACEL ) injection 0.5 mL (0.5 mLs Intramuscular Given 02/19/24 0009)  rabies immune globulin  (HYPERRAB) injection 2,400 Units (2,400 Units Intramuscular Given 02/19/24 0024)  rabies vaccine , human diploid (IMOVAX) injection 1 mL (1 mL Intramuscular Given 02/19/24 0011)   amoxicillin -clavulanate (AUGMENTIN ) 875-125 MG per tablet 1 tablet (1 tablet Oral Given 02/19/24 0011)  lidocaine -EPINEPHrine -tetracaine  (LET) topical gel (3 mLs Topical Given 02/19/24 0022)  lidocaine  (PF) (XYLOCAINE ) 1 % injection 10 mL (10 mLs Other Given 02/19/24 0011)    IMPRESSION / MDM / ASSESSMENT AND PLAN / ED COURSE  I reviewed the triage vital signs and the nursing notes.                                Patient's presentation is most consistent with acute presentation with potential threat to life or bodily function.  Differential diagnosis includes, but is not limited to, dog bite , laceration, infection, tendon or nv injury, fb or fracture    MDM:    The wound on the L palm deeper and required repair, loose, as above.  Punctate wound and abrasions thoroughly cleaned.   Tdap updated.  First dose of rabies IG and vaccine and guidance on completion of course in days to come.  Prophylactic abx w first dose in ED.   Fortunately no evidence of FB, fracture, tendon or NV injury.   Plan for dc w return precautions.        FINAL CLINICAL IMPRESSION(S) / ED DIAGNOSES   Final diagnoses:  Dog bite, initial encounter     Rx / DC Orders   ED Discharge Orders          Ordered    amoxicillin -clavulanate (AUGMENTIN ) 875-125 MG tablet  2 times daily        02/18/24 2355             Note:  This document was prepared using Dragon voice recognition software and may include unintentional dictation errors.    Cyrena Mylar, MD 02/19/24 [phone]  "

## 2024-02-18 NOTE — ED Notes (Signed)
 Return call from [employer] stating that an officer is in route for a report.

## 2024-02-27 ENCOUNTER — Ambulatory Visit: Admission: EM | Admit: 2024-02-27 | Discharge: 2024-02-27 | Disposition: A | Discharge status: Home / Self Care

## 2024-02-27 DIAGNOSIS — Z4802 Encounter for removal of sutures: Secondary | ICD-10-CM

## 2024-02-27 NOTE — ED Triage Notes (Signed)
 Patient to Urgent Care for suture removal. Patient had two sutures placed to left palm 1/13 after a dog bite.  Has some bleeding and drainage.  Patient was prescribed abx- took two days.

## 2024-02-27 NOTE — Discharge Instructions (Signed)
-   Soak hand in warm water and clean area daily.  Wipe away any skin and exudates. - If you notice increased redness, increased swelling, increased pain or thick white-yellowish drainage please return as you may have an infection.
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
